# Patient Record
Sex: Male | Born: 1945 | Race: Black or African American | Hispanic: No | Marital: Married | Smoking: Never smoker
Health system: Southern US, Community
[De-identification: ages and names within clinical notes are randomized; demographics above are authoritative.]

## PROBLEM LIST (undated history)

## (undated) DIAGNOSIS — D869 Sarcoidosis, unspecified: Secondary | ICD-10-CM

## (undated) DIAGNOSIS — J302 Other seasonal allergic rhinitis: Secondary | ICD-10-CM

## (undated) DIAGNOSIS — N529 Male erectile dysfunction, unspecified: Secondary | ICD-10-CM

## (undated) HISTORY — PX: BUNIONECTOMY: SHX129

## (undated) HISTORY — PX: KNEE SURGERY: SHX244

## (undated) HISTORY — PX: HAND SURGERY: SHX662

---

## 1995-06-08 DIAGNOSIS — D869 Sarcoidosis, unspecified: Secondary | ICD-10-CM

## 1995-06-08 HISTORY — DX: Sarcoidosis, unspecified: D86.9

## 2010-09-18 ENCOUNTER — Ambulatory Visit: Payer: Self-pay | Admitting: Family Medicine

## 2011-02-09 ENCOUNTER — Ambulatory Visit: Payer: Self-pay

## 2011-09-12 ENCOUNTER — Ambulatory Visit: Payer: Self-pay | Admitting: Internal Medicine

## 2012-05-03 ENCOUNTER — Ambulatory Visit: Payer: Self-pay | Admitting: Internal Medicine

## 2015-02-18 ENCOUNTER — Encounter: Payer: Self-pay | Admitting: Emergency Medicine

## 2015-02-18 ENCOUNTER — Ambulatory Visit
Admission: EM | Admit: 2015-02-18 | Discharge: 2015-02-18 | Disposition: A | Discharge status: Home / Self Care | Payer: PPO | Attending: Family Medicine | Admitting: Family Medicine

## 2015-02-18 DIAGNOSIS — J309 Allergic rhinitis, unspecified: Secondary | ICD-10-CM | POA: Diagnosis not present

## 2015-02-18 HISTORY — DX: Sarcoidosis, unspecified: D86.9

## 2015-02-18 MED ORDER — FLUTICASONE PROPIONATE 50 MCG/ACT NA SUSP: 2.0000 | Freq: Every day | NASAL | Status: DC

## 2015-02-18 MED ORDER — LORATADINE 10 MG PO TABS: 10.0000 mg | ORAL_TABLET | Freq: Every day | ORAL | Status: DC

## 2015-02-18 NOTE — Discharge Instructions (Signed)
Take medication as prescribed. Avoid triggers. Rest. Drink plenty of water.   Follow up with your primary care physician [place] this week as needed. Return to Urgent care or follow up with your primary care physician for continued complaints, fevers, sinus pressure, new or worsening concerns.   Allergic Rhinitis Allergic rhinitis is when the mucous membranes in the nose respond to allergens. Allergens are particles in the air that cause your body to have an allergic reaction. This causes you to release allergic antibodies. Through a chain of events, these eventually cause you to release histamine into the blood stream. Although meant to protect the body, it is this release of histamine that causes your discomfort, such as frequent sneezing, congestion, and an itchy, runny nose.  CAUSES  Seasonal allergic rhinitis (hay fever) is caused by pollen allergens that may come from grasses, trees, and weeds. Year-round allergic rhinitis (perennial allergic rhinitis) is caused by allergens such as house dust mites, pet dander, and mold spores.  SYMPTOMS   Nasal stuffiness (congestion).  Itchy, runny nose with sneezing and tearing of the eyes. DIAGNOSIS  Your health care provider can help you determine the allergen or allergens that trigger your symptoms. If you and your health care provider are unable to determine the allergen, skin or blood testing may be used. TREATMENT  Allergic rhinitis does not have a cure, but it can be controlled by:  Medicines and allergy shots (immunotherapy).  Avoiding the allergen. Hay fever may often be treated with antihistamines in pill or nasal spray forms. Antihistamines block the effects of histamine. There are over-the-counter medicines that may help with nasal congestion and swelling around the eyes. Check with your health care provider before taking or giving this medicine.  If avoiding the allergen or the medicine prescribed do not work, there are many new  medicines your health care provider can prescribe. Stronger medicine may be used if initial measures are ineffective. Desensitizing injections can be used if medicine and avoidance does not work. Desensitization is when a patient is given ongoing shots until the body becomes less sensitive to the allergen. Make sure you follow up with your health care provider if problems continue. HOME CARE INSTRUCTIONS It is not possible to completely avoid allergens, but you can reduce your symptoms by taking steps to limit your exposure to them. It helps to know exactly what you are allergic to so that you can avoid your specific triggers. SEEK MEDICAL CARE IF:   You have a fever.  You develop a cough that does not stop easily (persistent).  You have shortness of breath.  You start wheezing.  Symptoms interfere with normal daily activities. Document Released: 02/16/2001 Document Revised: 05/29/2013 Document Reviewed: 01/29/2013 [facility] Patient Information 2015 [place], [place]. This information is not intended to replace advice given to you by your health care provider. Make sure you discuss any questions you have with your health care provider.

## 2015-02-18 NOTE — ED Notes (Signed)
Patient c/o nasal congestion, sinus pressure and congestion for 2 days. Patient denies fevers.

## 2015-02-18 NOTE — ED Provider Notes (Signed)
[facility] Emergency Department Provider Note  ____________________________________________  Time seen: Approximately 2:37 PM  I have reviewed the triage vital signs and the nursing notes.   HISTORY  Chief Complaint Facial Pain and Nasal Congestion   HPI Gabriel Decker is a 69 y.o. male presents for complaints of 2 days of runny nose and nasal congestion. Patient reports that he has been sneezing and having an itchy nose. Patient reports that he noticed it Sunday afternoon after being outside and then states that Monday he mowed his yard which worsened symptoms. Denies continued cough but states that he feels a tickle in the back of his throat frequently and feels that he has to cough some to clear throat.  Denies fevers. Denies headache. Denies chest pain, shortness of breath, abdominal pain, wheezing or other complaints. Reports continues to eat and drink well. Denies weakness. Denies pain.  Reports he has some history of seasonal allergies and feels similar. States "I don't think I need an antibiotic."    Past Medical History  Diagnosis Date  . Sarcoidosis     There are no active problems to display for this patient.   Past Surgical History  Procedure Laterality Date  . Knee surgery Bilateral     Current Outpatient Rx  Name  Route  Sig  Dispense  Refill  . none          .             Allergies codeine  History reviewed. No pertinent family history.  Social History Social History  Substance Use Topics  . Smoking status: Never Smoker   . Smokeless tobacco: None  . Alcohol Use: Yes    Review of Systems Constitutional: No fever/chills Eyes: No visual changes. ENT: runny nose, congestion, sneezing.  Cardiovascular: Denies chest pain. Respiratory: Denies shortness of breath. Gastrointestinal: No abdominal pain.  No nausea, no vomiting.  No diarrhea.  No constipation. Genitourinary: Negative for dysuria. Musculoskeletal: Negative for  back pain. Skin: Negative for rash. Neurological: Negative for headaches, focal weakness or numbness.  10-point ROS otherwise negative.  ____________________________________________   PHYSICAL EXAM:  VITAL SIGNS: ED Triage Vitals  Enc Vitals Group     BP 02/18/15 1410 131/87 mmHg     Pulse Rate 02/18/15 1410 58     Resp 02/18/15 1410 16     Temp 02/18/15 1410 97.3 F (36.3 C)     Temp Source 02/18/15 1410 Tympanic     SpO2 02/18/15 1410 100 %     Weight 02/18/15 1410 168 lb (76.204 kg)     Height 02/18/15 1410 5\' 9"  (1.753 m)     Head Cir --      Peak Flow --      Pain Score --      Pain Loc --      Pain Edu? --      Excl. in GC? --     Constitutional: Alert and oriented. Well appearing and in no acute distress. Eyes: Conjunctivae are normal. PERRL. EOMI. Head: Atraumatic.  Ears: no erythema, normal TMs bilaterally.   Nose: mild clear runny nose, nares patent, mild bilateral nasal turbinate edema.   Mouth/Throat: Mucous membranes are moist.  Oropharynx non-erythematous. Neck: No stridor.  No cervical spine tenderness to palpation. Hematological/Lymphatic/Immunilogical: No cervical lymphadenopathy. Cardiovascular: Normal rate, regular rhythm. Grossly normal heart sounds.  Good peripheral circulation. Respiratory: Normal respiratory effort.  No retractions. Lungs CTAB. Gastrointestinal: Soft and nontender. No distention. Normal Bowel sounds.  No abdominal bruits. No CVA tenderness. Musculoskeletal: No lower or upper extremity tenderness nor edema.  No joint effusions. Bilateral pedal pulses equal and easily palpated.  Neurologic:  Normal speech and language. No gross focal neurologic deficits are appreciated. No gait instability. Skin:  Skin is warm, dry and intact. No rash noted. Psychiatric: Mood and affect are normal. Speech and behavior are normal.  ____________________________________________   LABS (all labs ordered are listed, but only abnormal results are  displayed)  Labs Reviewed - No data to display ____________________________________________ INITIAL IMPRESSION / ASSESSMENT AND PLAN / ED COURSE  Pertinent labs & imaging results that were available during my care of the patient were reviewed by me and considered in my medical decision making (see chart for details).  Very well appearing. No acute distress. Presents for complaints of 2 days runny nose, sneezing, itchy nose after being outside more. Reports takes OTC Claritin normally but states ran out. States history of similar in past and had good results with "nasal spray". Will treat with allergic rhinitis with claritin and fluticasone nasal spray daily, supportive treatments and avoidance of triggers. Discussed strict follow up and return parameters. Follow up with PCP this week as needed. Patient verbalized understanding and agreed to plan.  ____________________________________________   FINAL CLINICAL IMPRESSION(S) / ED DIAGNOSES  Final diagnoses:  Allergic rhinitis, unspecified allergic rhinitis type       Renford Dills, NP 02/18/15 1514

## 2015-07-27 ENCOUNTER — Ambulatory Visit
Admission: EM | Admit: 2015-07-27 | Discharge: 2015-07-27 | Disposition: A | Discharge status: Home / Self Care | Payer: PPO | Attending: Family Medicine | Admitting: Family Medicine

## 2015-07-27 DIAGNOSIS — J0100 Acute maxillary sinusitis, unspecified: Secondary | ICD-10-CM | POA: Diagnosis not present

## 2015-07-27 MED ORDER — LORATADINE-PSEUDOEPHEDRINE ER 10-240 MG PO TB24: 1.0000 | ORAL_TABLET | Freq: Every day | ORAL | Status: DC

## 2015-07-27 MED ORDER — AMOXICILLIN-POT CLAVULANATE 875-125 MG PO TABS: 1.0000 | ORAL_TABLET | Freq: Two times a day (BID) | ORAL | Status: DC

## 2015-07-27 NOTE — Discharge Instructions (Signed)
Sinusitis, Adult Sinusitis is redness, soreness, and puffiness (inflammation) of the air pockets in the bones of your face (sinuses). The redness, soreness, and puffiness can cause air and mucus to get trapped in your sinuses. This can allow germs to grow and cause an infection.  HOME CARE   Drink enough fluids to keep your pee (urine) clear or pale yellow.  Use a humidifier in your home.  Run a hot shower to create steam in the bathroom. Sit in the bathroom with the door closed. Breathe in the steam 3-4 times a day.  Put a warm, moist washcloth on your face 3-4 times a day, or as told by your doctor.  Use salt water sprays (saline sprays) to wet the thick fluid in your nose. This can help the sinuses drain.  Only take medicine as told by your doctor. GET HELP RIGHT AWAY IF:   Your pain gets worse.  You have very bad headaches.  You are sick to your stomach (nauseous).  You throw up (vomit).  You are very sleepy (drowsy) all the time.  Your face is puffy (swollen).  Your vision changes.  You have a stiff neck.  You have trouble breathing. MAKE SURE YOU:   Understand these instructions.  Will watch your condition.  Will get help right away if you are not doing well or get worse.   This information is not intended to replace advice given to you by your health care provider. Make sure you discuss any questions you have with your health care provider.   Document Released: 11/10/2007 Document Revised: 06/14/2014 Document Reviewed: 12/28/2011 Elsevier Interactive Patient Education 2016 [employer].  Sinusitis, Adult Sinusitis is redness, soreness, and puffiness (inflammation) of the air pockets in the bones of your face (sinuses). The redness, soreness, and puffiness can cause air and mucus to get trapped in your sinuses. This can allow germs to grow and cause an infection.  HOME CARE   Drink enough fluids to keep your pee (urine) clear or pale yellow.  Use a  humidifier in your home.  Run a hot shower to create steam in the bathroom. Sit in the bathroom with the door closed. Breathe in the steam 3-4 times a day.  Put a warm, moist washcloth on your face 3-4 times a day, or as told by your doctor.  Use salt water sprays (saline sprays) to wet the thick fluid in your nose. This can help the sinuses drain.  Only take medicine as told by your doctor. GET HELP RIGHT AWAY IF:   Your pain gets worse.  You have very bad headaches.  You are sick to your stomach (nauseous).  You throw up (vomit).  You are very sleepy (drowsy) all the time.  Your face is puffy (swollen).  Your vision changes.  You have a stiff neck.  You have trouble breathing. MAKE SURE YOU:   Understand these instructions.  Will watch your condition.  Will get help right away if you are not doing well or get worse.   This information is not intended to replace advice given to you by your health care provider. Make sure you discuss any questions you have with your health care provider.   Document Released: 11/10/2007 Document Revised: 06/14/2014 Document Reviewed: 12/28/2011 Elsevier Interactive Patient Education 2016 [employer] Inc[employer]  Upper Respiratory Infection, Adult Most upper respiratory infections (URIs) are caused by a virus. A URI affects the nose, throat, and upper air passages. The most common type of URI is  often called "the common cold." HOME CARE   Take medicines only as told by your doctor.  Gargle warm saltwater or take cough drops to comfort your throat as told by your doctor.  Use a warm mist humidifier or inhale steam from a shower to increase air moisture. This may make it easier to breathe.  Drink enough fluid to keep your pee (urine) clear or pale yellow.  Eat soups and other clear broths.  Have a healthy diet.  Rest as needed.  Go back to work when your fever is gone or your doctor says it is okay.  You may need to stay home longer to  avoid giving your URI to others.  You can also wear a face mask and wash your hands often to prevent spread of the virus.  Use your inhaler more if you have asthma.  Do not use any tobacco products, including cigarettes, chewing tobacco, or electronic cigarettes. If you need help quitting, ask your doctor. GET HELP IF:  You are getting worse, not better.  Your symptoms are not helped by medicine.  You have chills.  You are getting more short of breath.  You have brown or red mucus.  You have yellow or brown discharge from your nose.  You have pain in your face, especially when you bend forward.  You have a fever.  You have puffy (swollen) neck glands.  You have pain while swallowing.  You have white areas in the back of your throat. GET HELP RIGHT AWAY IF:   You have very bad or constant:  Headache.  Ear pain.  Pain in your forehead, behind your eyes, and over your cheekbones (sinus pain).  Chest pain.  You have long-lasting (chronic) lung disease and any of the following:  Wheezing.  Long-lasting cough.  Coughing up blood.  A change in your usual mucus.  You have a stiff neck.  You have changes in your:  Vision.  Hearing.  Thinking.  Mood. MAKE SURE YOU:   Understand these instructions.  Will watch your condition.  Will get help right away if you are not doing well or get worse.   This information is not intended to replace advice given to you by your health care provider. Make sure you discuss any questions you have with your health care provider.   Document Released: 11/10/2007 Document Revised: 10/08/2014 Document Reviewed: 08/29/2013 Elsevier Interactive Patient Education [employer]

## 2015-07-27 NOTE — ED Provider Notes (Signed)
CSN: [id number]     Arrival date & time 07/27/15  1526 History   First MD Initiated Contact with Patient 07/27/15 1723    Nurses notes were reviewed. Chief Complaint  Patient presents with  . Sinusitis   patient is a sinusitis he states for the last 6 days he's had increased pressure behind his eyes and lungs nostril area. Yellowish-green when blows his nose. This morning he states he was quite concerned because he saw blood as well. He's had trouble sinus infections before. He uses Flonase nasal spray but further questioning he can taste the Flonase without back stent. He also uses Claritin and neither those 2 agents helped him any  Patient does not smoke and denies significant medical problems biliary is a history according to his chart sarcoidosis but not sure this is active and not. He's never smoked and he states that there is a history hypertension the family but he doesn't have it. (Consider location/radiation/quality/duration/timing/severity/associated sxs/prior Treatment) Patient is a 70 y.o. male presenting with sinusitis. The history is provided by the patient. No language interpreter was used.  Sinusitis Pain details:    Location:  Maxillary   Quality:  Aching   Severity:  Moderate   Duration:  6 days Progression:  Worsening Chronicity:  New Context: recent URI   Relieved by:  Nothing Ineffective treatments:  Spray decongestants and oral decongestants Associated symptoms: sneezing   Associated symptoms: no chest pain, no chills, no fever, no hoarse voice, no shortness of breath, no sore throat and no wheezing   Associated symptoms comment:  Epistaxis   Past Medical History  Diagnosis Date  . Sarcoidosis [facility])    Past Surgical History  Procedure Laterality Date  . Knee surgery Bilateral    History reviewed. No pertinent family history. Social History  Substance Use Topics  . Smoking status: Never Smoker   . Smokeless tobacco: None  . Alcohol Use: 0.0 oz/week    0  Standard drinks or equivalent per week     Comment: occasional    Review of Systems  Constitutional: Negative for fever and chills.  HENT: Positive for sneezing. Negative for hoarse voice and sore throat.   Respiratory: Negative for shortness of breath and wheezing.   Cardiovascular: Negative for chest pain.  All other systems reviewed and are negative.   Allergies  Lodine  Home Medications   Prior to Admission medications   Medication Sig Start Date End Date Taking? Authorizing Provider  fluticasone (FLONASE) 50 MCG/ACT nasal spray Place 2 sprays into both nostrils daily. 02/18/15  Yes Renford Dills, NP  loratadine (CLARITIN) 10 MG tablet Take 1 tablet (10 mg total) by mouth daily. 02/18/15  Yes Renford Dills, NP  amoxicillin-clavulanate (AUGMENTIN) 875-125 MG tablet Take 1 tablet by mouth 2 (two) times daily. 07/27/15   Hassan Rowan, MD  loratadine-pseudoephedrine (CLARITIN-D 24 HOUR) 10-240 MG 24 hr tablet Take 1 tablet by mouth daily. Do not take with plain Claritin 07/27/15   Hassan Rowan, MD   Meds Ordered and Administered this Visit  Medications - No data to display  BP 128/81 mmHg  Pulse 59  Temp(Src) 97.1 F (36.2 C) (Oral)  Resp 16  Ht  (1.753 m)  Wt 171 lb (77.565 kg)  BMI 25.24 kg/m2  SpO2 100% No data found.   Physical Exam  Constitutional: He is oriented to person, place, and time. He appears well-developed and well-nourished.  HENT:  Head: Normocephalic and atraumatic.  Right Ear: Hearing, tympanic membrane and  external ear normal.  Left Ear: Hearing, tympanic membrane, external ear and ear canal normal.  Nose: Mucosal edema present. Right sinus exhibits maxillary sinus tenderness. Left sinus exhibits maxillary sinus tenderness.  Mouth/Throat: Posterior oropharyngeal erythema present.  Eyes: Pupils are equal, round, and reactive to light.  Neck: Normal range of motion. Neck supple.  Cardiovascular: Normal rate and regular rhythm.   Pulmonary/Chest:  Effort normal.  Musculoskeletal: Normal range of motion.  Lymphadenopathy:    He has cervical adenopathy.  Neurological: He is alert and oriented to person, place, and time.  Skin: Skin is warm and dry. No erythema.  Psychiatric: He has a normal mood and affect.  Vitals reviewed.   ED Course  Procedures (including critical care time)  Labs Review Labs Reviewed - No data to display  Imaging Review No results found.   Visual Acuity Review  Right Eye Distance:   Left Eye Distance:   Bilateral Distance:    Right Eye Near:   Left Eye Near:    Bilateral Near:         MDM   1. Acute maxillary sinusitis, recurrence not specified    We'll place patient on Augmentin 875 one tablet twice a day and we'll switch him from Claritin to Claritin-D 1 tablet daily. We'll give instructions how to use the Flonase to display more effective since he can taste it in appropriate explained to him that if he can taste it is not. Recommend follow-up with his PCP in 2 weeks. If not better. Offer work note patient laughed.  Note: This dictation was prepared with Dragon dictation along with smaller phrase technology. Any transcriptional errors that result from this process are unintentional.  Hassan Rowan, MD 07/27/15 1740

## 2015-07-27 NOTE — ED Notes (Signed)
Patient complains of sinus pain and pressure. Patient states that he has been blowing green and blood tinged mucus. He states that symptoms started 1 week ago and have not improved, patient has tried Claritin and Flonase with no relief.

## 2015-10-07 DIAGNOSIS — D8683 Sarcoid iridocyclitis: Secondary | ICD-10-CM | POA: Diagnosis not present

## 2015-10-29 DIAGNOSIS — S4991XA Unspecified injury of right shoulder and upper arm, initial encounter: Secondary | ICD-10-CM | POA: Diagnosis not present

## 2015-10-29 DIAGNOSIS — M25511 Pain in right shoulder: Secondary | ICD-10-CM | POA: Diagnosis not present

## 2015-10-29 DIAGNOSIS — M19011 Primary osteoarthritis, right shoulder: Secondary | ICD-10-CM | POA: Diagnosis not present

## 2015-10-29 DIAGNOSIS — M7581 Other shoulder lesions, right shoulder: Secondary | ICD-10-CM | POA: Diagnosis not present

## 2015-11-03 DIAGNOSIS — M25511 Pain in right shoulder: Secondary | ICD-10-CM | POA: Diagnosis not present

## 2015-11-10 DIAGNOSIS — M25511 Pain in right shoulder: Secondary | ICD-10-CM | POA: Diagnosis not present

## 2015-11-17 DIAGNOSIS — M25511 Pain in right shoulder: Secondary | ICD-10-CM | POA: Diagnosis not present

## 2015-11-27 DIAGNOSIS — Z125 Encounter for screening for malignant neoplasm of prostate: Secondary | ICD-10-CM | POA: Diagnosis not present

## 2015-11-27 DIAGNOSIS — Z0000 Encounter for general adult medical examination without abnormal findings: Secondary | ICD-10-CM | POA: Diagnosis not present

## 2015-11-27 DIAGNOSIS — Z23 Encounter for immunization: Secondary | ICD-10-CM | POA: Diagnosis not present

## 2015-11-27 DIAGNOSIS — Z79899 Other long term (current) drug therapy: Secondary | ICD-10-CM | POA: Diagnosis not present

## 2016-02-06 DIAGNOSIS — M25511 Pain in right shoulder: Secondary | ICD-10-CM | POA: Diagnosis not present

## 2016-03-05 DIAGNOSIS — Z23 Encounter for immunization: Secondary | ICD-10-CM | POA: Diagnosis not present

## 2016-03-31 DIAGNOSIS — R030 Elevated blood-pressure reading, without diagnosis of hypertension: Secondary | ICD-10-CM | POA: Diagnosis not present

## 2016-05-06 ENCOUNTER — Encounter
Admission: RE | Admit: 2016-05-06 | Discharge: 2016-05-06 | Disposition: A | Discharge status: Home / Self Care | Payer: PPO | Source: Ambulatory Visit | Attending: Surgery | Admitting: Surgery

## 2016-05-06 NOTE — Patient Instructions (Signed)
  Your procedure is scheduled on: 05-13-16 [facility](THURSDAY) Report to Same Day Surgery 2nd floor medical mall [facility](Medical Mall Entrance-take elevator on left to 2nd floor.  Check in with surgery information desk.) To find out your arrival time please call [phone] between 1PM - 3PM on 05-12-16 [facility](WEDNESDAY)  Remember: Instructions that are not followed completely may result in serious medical risk, up to and including death, or upon the discretion of your surgeon and anesthesiologist your surgery may need to be rescheduled.    _x___ 1. Do not eat food or drink liquids after midnight. No gum chewing or hard candies.     __x__ 2. No Alcohol for 24 hours before or after surgery.   __x__3. No Smoking for 24 prior to surgery.   ____  4. Bring all medications with you on the day of surgery if instructed.    __x__ 5. Notify your doctor if there is any change in your medical condition     (cold, fever, infections).     Do not wear jewelry, make-up, hairpins, clips or nail polish.  Do not wear lotions, powders, or perfumes. You may wear deodorant.  Do not shave 48 hours prior to surgery. Men may shave face and neck.  Do not bring valuables to the hospital.    [facility] is not responsible for any belongings or valuables.               Contacts, dentures or bridgework may not be worn into surgery.  Leave your suitcase in the car. After surgery it may be brought to your room.  For patients admitted to the hospital, discharge time is determined by your treatment team.   Patients discharged the day of surgery will not be allowed to drive home.  You will need someone to drive you home and stay with you the night of your procedure.    Please read over the following fact sheets that you were given:   [facility] Preparing [facility] and or MRSA Information   ____ Take these medicines the morning of surgery with A SIP OF WATER:    1. NONE  2.  3.  4.  5.  6.  ____Fleets enema or Magnesium Citrate  as directed.   _x___ Use CHG Soap or sage wipes as directed on instruction sheet   ____ Use inhalers on the day of surgery and bring to hospital day of surgery  ____ Stop metformin 2 days prior to surgery    ____ Take 1/2 of usual insulin dose the night before surgery and none on the morning of  surgery.   _X___ Stop Aspirin, Coumadin, Pllavix ,Eliquis, Effient, or Pradaxa-STOP ASPIRIN NOW  x__ Stop Anti-inflammatories such as Advil, Aleve, Ibuprofen, Motrin, Naproxen,          Naprosyn, Goodies powders or aspirin products. Ok to take Tylenol.   _X___ Stop supplements until after surgery-STOP VITAMIN C NOW  ____ Bring C-Pap to the hospital.

## 2016-05-10 ENCOUNTER — Inpatient Hospital Stay: Admission: RE | Admit: 2016-05-10 | Payer: PPO | Source: Ambulatory Visit

## 2016-05-10 ENCOUNTER — Encounter
Admission: RE | Admit: 2016-05-10 | Discharge: 2016-05-10 | Disposition: A | Discharge status: Home / Self Care | Payer: PPO | Source: Ambulatory Visit | Attending: Surgery | Admitting: Surgery

## 2016-05-10 DIAGNOSIS — M75121 Complete rotator cuff tear or rupture of right shoulder, not specified as traumatic: Secondary | ICD-10-CM

## 2016-05-10 DIAGNOSIS — Z01812 Encounter for preprocedural laboratory examination: Secondary | ICD-10-CM | POA: Insufficient documentation

## 2016-05-10 DIAGNOSIS — Z01810 Encounter for preprocedural cardiovascular examination: Secondary | ICD-10-CM | POA: Diagnosis not present

## 2016-05-10 DIAGNOSIS — M7541 Impingement syndrome of right shoulder: Secondary | ICD-10-CM | POA: Diagnosis not present

## 2016-05-10 LAB — CBC
HCT: 46.6 % (ref 40.0–52.0)
HEMOGLOBIN: 16.1 g/dL (ref 13.0–18.0)
MCH: 30.6 pg (ref 26.0–34.0)
MCHC: 34.6 g/dL (ref 32.0–36.0)
MCV: 88.4 fL (ref 80.0–100.0)
Platelets: 141 10*3/uL — ABNORMAL LOW (ref 150–440)
RBC: 5.27 MIL/uL (ref 4.40–5.90)
RDW: 13.6 % (ref 11.5–14.5)
WBC: 2.9 10*3/uL — AB (ref 3.8–10.6)

## 2016-05-13 ENCOUNTER — Ambulatory Visit
Admission: RE | Admit: 2016-05-13 | Discharge: 2016-05-13 | Disposition: A | Discharge status: Home / Self Care | Payer: PPO | Source: Ambulatory Visit | Attending: Surgery | Admitting: Surgery

## 2016-05-13 ENCOUNTER — Encounter
Admission: RE | Disposition: A | Discharge status: Home / Self Care | Payer: Self-pay | Source: Ambulatory Visit | Attending: Surgery

## 2016-05-13 ENCOUNTER — Ambulatory Visit: Payer: PPO | Admitting: Anesthesiology

## 2016-05-13 ENCOUNTER — Encounter: Payer: Self-pay | Admitting: *Deleted

## 2016-05-13 DIAGNOSIS — M75121 Complete rotator cuff tear or rupture of right shoulder, not specified as traumatic: Secondary | ICD-10-CM | POA: Insufficient documentation

## 2016-05-13 DIAGNOSIS — M75101 Unspecified rotator cuff tear or rupture of right shoulder, not specified as traumatic: Secondary | ICD-10-CM | POA: Diagnosis not present

## 2016-05-13 DIAGNOSIS — M7541 Impingement syndrome of right shoulder: Secondary | ICD-10-CM | POA: Diagnosis not present

## 2016-05-13 DIAGNOSIS — Z5333 Arthroscopic surgical procedure converted to open procedure: Secondary | ICD-10-CM | POA: Diagnosis not present

## 2016-05-13 DIAGNOSIS — M7581 Other shoulder lesions, right shoulder: Secondary | ICD-10-CM | POA: Diagnosis not present

## 2016-05-13 DIAGNOSIS — M25511 Pain in right shoulder: Secondary | ICD-10-CM | POA: Diagnosis not present

## 2016-05-13 DIAGNOSIS — M24111 Other articular cartilage disorders, right shoulder: Secondary | ICD-10-CM | POA: Diagnosis not present

## 2016-05-13 DIAGNOSIS — G8918 Other acute postprocedural pain: Secondary | ICD-10-CM | POA: Diagnosis not present

## 2016-05-13 HISTORY — PX: SHOULDER ARTHROSCOPY WITH SUBACROMIAL DECOMPRESSION: SHX5684

## 2016-05-13 HISTORY — PX: SHOULDER ARTHROSCOPY WITH OPEN ROTATOR CUFF REPAIR: SHX6092

## 2016-05-13 SURGERY — ARTHROSCOPY, SHOULDER WITH REPAIR, ROTATOR CUFF, OPEN
Anesthesia: General | Site: Shoulder | Laterality: Right | Wound class: Clean

## 2016-05-13 MED ORDER — ROPIVACAINE HCL 5 MG/ML IJ SOLN
INTRAMUSCULAR | Status: AC
  Administered 2016-05-13: 30 mL via PERINEURAL
  Filled 2016-05-13: qty 40

## 2016-05-13 MED ORDER — FENTANYL CITRATE (PF) 100 MCG/2ML IJ SOLN
INTRAMUSCULAR | Status: DC | PRN
  Administered 2016-05-13 (×3): 50 ug via INTRAVENOUS

## 2016-05-13 MED ORDER — BUPIVACAINE HCL (PF) 0.5 % IJ SOLN
INTRAMUSCULAR | Status: AC
  Filled 2016-05-13: qty 30

## 2016-05-13 MED ORDER — MIDAZOLAM HCL 2 MG/2ML IJ SOLN
INTRAMUSCULAR | Status: AC
  Administered 2016-05-13: 1.5 mg via INTRAVENOUS
  Filled 2016-05-13: qty 2

## 2016-05-13 MED ORDER — FAMOTIDINE 20 MG PO TABS
ORAL_TABLET | ORAL | Status: AC
  Administered 2016-05-13: 20 mg via ORAL
  Filled 2016-05-13: qty 1

## 2016-05-13 MED ORDER — GLYCOPYRROLATE 0.2 MG/ML IJ SOLN
INTRAMUSCULAR | Status: DC | PRN
  Administered 2016-05-13: 0.3 mg via INTRAVENOUS
  Administered 2016-05-13: 0.2 mg via INTRAVENOUS

## 2016-05-13 MED ORDER — PROPOFOL 10 MG/ML IV BOLUS
INTRAVENOUS | Status: DC | PRN
  Administered 2016-05-13: 200 mg via INTRAVENOUS

## 2016-05-13 MED ORDER — EPINEPHRINE PF 1 MG/ML IJ SOLN
INTRAMUSCULAR | Status: AC
  Filled 2016-05-13: qty 1

## 2016-05-13 MED ORDER — LACTATED RINGERS IV SOLN
INTRAVENOUS | Status: DC
  Administered 2016-05-13 (×2): via INTRAVENOUS

## 2016-05-13 MED ORDER — CEFAZOLIN SODIUM-DEXTROSE 2-4 GM/100ML-% IV SOLN
2.0000 g | Freq: Once | INTRAVENOUS | Status: AC
  Administered 2016-05-13: 2 g via INTRAVENOUS

## 2016-05-13 MED ORDER — METOCLOPRAMIDE HCL 5 MG/ML IJ SOLN: 5.0000 mg | Freq: Three times a day (TID) | INTRAMUSCULAR | Status: DC | PRN

## 2016-05-13 MED ORDER — LIDOCAINE HCL (PF) 4 % IJ SOLN
INTRAMUSCULAR | Status: DC | PRN
  Administered 2016-05-13: 4 mL via RESPIRATORY_TRACT

## 2016-05-13 MED ORDER — EPINEPHRINE PF 1 MG/ML IJ SOLN
INTRAMUSCULAR | Status: DC | PRN
  Administered 2016-05-13: 2 mL

## 2016-05-13 MED ORDER — ONDANSETRON HCL 4 MG PO TABS: 4.0000 mg | ORAL_TABLET | Freq: Four times a day (QID) | ORAL | Status: DC | PRN

## 2016-05-13 MED ORDER — FAMOTIDINE 20 MG PO TABS
20.0000 mg | ORAL_TABLET | Freq: Once | ORAL | Status: AC
  Administered 2016-05-13: 20 mg via ORAL

## 2016-05-13 MED ORDER — EPINEPHRINE PF 1 MG/ML IJ SOLN
INTRAMUSCULAR | Status: AC
  Filled 2016-05-13: qty 3

## 2016-05-13 MED ORDER — ONDANSETRON HCL 4 MG/2ML IJ SOLN: 4.0000 mg | Freq: Four times a day (QID) | INTRAMUSCULAR | Status: DC | PRN

## 2016-05-13 MED ORDER — BUPIVACAINE-EPINEPHRINE 0.25% -1:200000 IJ SOLN
INTRAMUSCULAR | Status: DC | PRN
  Administered 2016-05-13: 30 mL

## 2016-05-13 MED ORDER — MIDAZOLAM HCL 2 MG/2ML IJ SOLN
1.5000 mg | Freq: Once | INTRAMUSCULAR | Status: AC
  Administered 2016-05-13: 1.5 mg via INTRAVENOUS

## 2016-05-13 MED ORDER — ONDANSETRON HCL 4 MG/2ML IJ SOLN
INTRAMUSCULAR | Status: DC | PRN
  Administered 2016-05-13: 4 mg via INTRAVENOUS

## 2016-05-13 MED ORDER — LIDOCAINE HCL (CARDIAC) 20 MG/ML IV SOLN
INTRAVENOUS | Status: DC | PRN
  Administered 2016-05-13: 70 mg via INTRAVENOUS

## 2016-05-13 MED ORDER — LIDOCAINE HCL (PF) 1 % IJ SOLN
5.0000 mL | Freq: Once | INTRAMUSCULAR | Status: AC
  Administered 2016-05-13: 5 mL via INTRADERMAL

## 2016-05-13 MED ORDER — LIDOCAINE HCL (PF) 1 % IJ SOLN
INTRAMUSCULAR | Status: AC
  Administered 2016-05-13: 5 mL via INTRADERMAL
  Filled 2016-05-13: qty 5

## 2016-05-13 MED ORDER — ROPIVACAINE HCL 5 MG/ML IJ SOLN
30.0000 mL | Freq: Once | INTRAMUSCULAR | Status: AC
  Administered 2016-05-13: 30 mL via PERINEURAL
  Filled 2016-05-13: qty 30

## 2016-05-13 MED ORDER — FENTANYL CITRATE (PF) 100 MCG/2ML IJ SOLN: 25.0000 ug | INTRAMUSCULAR | Status: DC | PRN

## 2016-05-13 MED ORDER — DEXAMETHASONE SODIUM PHOSPHATE 4 MG/ML IJ SOLN
INTRAMUSCULAR | Status: DC | PRN
Start: 2016-05-13 — End: 2016-05-13
  Administered 2016-05-13: 8 mg via INTRAVENOUS

## 2016-05-13 MED ORDER — SUGAMMADEX SODIUM 200 MG/2ML IV SOLN
INTRAVENOUS | Status: DC | PRN
  Administered 2016-05-13: 150 mg via INTRAVENOUS

## 2016-05-13 MED ORDER — CEFAZOLIN SODIUM-DEXTROSE 2-4 GM/100ML-% IV SOLN
INTRAVENOUS | Status: AC
  Filled 2016-05-13: qty 100

## 2016-05-13 MED ORDER — ROCURONIUM BROMIDE 100 MG/10ML IV SOLN
INTRAVENOUS | Status: DC | PRN
  Administered 2016-05-13: 50 mg via INTRAVENOUS

## 2016-05-13 MED ORDER — POTASSIUM CHLORIDE IN NACL 20-0.9 MEQ/L-% IV SOLN: INTRAVENOUS | Status: DC

## 2016-05-13 MED ORDER — OXYCODONE HCL 5 MG PO TABS: 5.0000 mg | ORAL_TABLET | ORAL | Status: DC | PRN

## 2016-05-13 MED ORDER — BUPIVACAINE-EPINEPHRINE (PF) 0.25% -1:200000 IJ SOLN
INTRAMUSCULAR | Status: AC
  Filled 2016-05-13: qty 30

## 2016-05-13 MED ORDER — OXYCODONE HCL 5 MG PO TABS: 5.0000 mg | ORAL_TABLET | ORAL | 0 refills | Status: DC | PRN

## 2016-05-13 MED ORDER — FENTANYL CITRATE (PF) 100 MCG/2ML IJ SOLN
INTRAMUSCULAR | Status: AC
  Filled 2016-05-13: qty 2

## 2016-05-13 MED ORDER — ONDANSETRON HCL 4 MG/2ML IJ SOLN: 4.0000 mg | Freq: Once | INTRAMUSCULAR | Status: DC | PRN

## 2016-05-13 MED ORDER — PHENYLEPHRINE HCL 10 MG/ML IJ SOLN
INTRAVENOUS | Status: DC | PRN
  Administered 2016-05-13: 25 ug/min via INTRAVENOUS

## 2016-05-13 MED ORDER — METOCLOPRAMIDE HCL 10 MG PO TABS: 5.0000 mg | ORAL_TABLET | Freq: Three times a day (TID) | ORAL | Status: DC | PRN

## 2016-05-13 SURGICAL SUPPLY — 46 items
ANCHOR JUGGERKNOT WTAP NDL 2.9 (Anchor) ×6 IMPLANT
ANCHOR SUT QUATTRO KNTLS 4.5 (Anchor) ×6 IMPLANT
BIT DRILL JUGRKNT W/NDL BIT2.9 (DRILL) ×1 IMPLANT
BLADE FULL RADIUS 3.5 (BLADE) ×3 IMPLANT
BLADE SHAVER 4.5X7 STR FR (MISCELLANEOUS) ×3 IMPLANT
BUR ACROMIONIZER 4.0 (BURR) ×3 IMPLANT
BUR BR 5.5 WIDE MOUTH (BURR) IMPLANT
CANNULA SHAVER 8MMX76MM (CANNULA) ×3 IMPLANT
CHLORAPREP W/TINT 26ML (MISCELLANEOUS) ×6 IMPLANT
COVER MAYO STAND STRL (DRAPES) ×3 IMPLANT
DRAPE IMP U-DRAPE 54X76 (DRAPES) ×6 IMPLANT
DRILL JUGGERKNOT W/NDL BIT 2.9 (DRILL) ×3
DRSG OPSITE POSTOP 4X8 (GAUZE/BANDAGES/DRESSINGS) ×3 IMPLANT
ELECT REM PT RETURN 9FT ADLT (ELECTROSURGICAL) ×3
ELECTRODE REM PT RTRN 9FT ADLT (ELECTROSURGICAL) ×1 IMPLANT
GAUZE PETRO XEROFOAM 1X8 (MISCELLANEOUS) ×3 IMPLANT
GAUZE SPONGE 4X4 12PLY STRL (GAUZE/BANDAGES/DRESSINGS) ×3 IMPLANT
GLOVE BIO SURGEON STRL SZ7.5 (GLOVE) ×6 IMPLANT
GLOVE BIO SURGEON STRL SZ8 (GLOVE) ×6 IMPLANT
GLOVE BIOGEL PI IND STRL 8 (GLOVE) ×1 IMPLANT
GLOVE BIOGEL PI INDICATOR 8 (GLOVE) ×2
GLOVE INDICATOR 8.0 STRL GRN (GLOVE) ×3 IMPLANT
GOWN STRL REUS W/ TWL LRG LVL3 (GOWN DISPOSABLE) ×2 IMPLANT
GOWN STRL REUS W/ TWL XL LVL3 (GOWN DISPOSABLE) ×1 IMPLANT
GOWN STRL REUS W/TWL LRG LVL3 (GOWN DISPOSABLE) ×4
GOWN STRL REUS W/TWL XL LVL3 (GOWN DISPOSABLE) ×2
GRASPER SUT 15 45D LOW PRO (SUTURE) IMPLANT
IV LACTATED RINGER IRRG 3000ML (IV SOLUTION) ×4
IV LR IRRIG 3000ML ARTHROMATIC (IV SOLUTION) ×2 IMPLANT
MANIFOLD NEPTUNE II (INSTRUMENTS) ×3 IMPLANT
MASK FACE SPIDER DISP (MASK) ×3 IMPLANT
MAT BLUE FLOOR 46X72 FLO (MISCELLANEOUS) ×3 IMPLANT
NEEDLE REVERSE CUT 1/2 CRC (NEEDLE) IMPLANT
PACK ARTHROSCOPY SHOULDER (MISCELLANEOUS) ×3 IMPLANT
SLING ARM LRG DEEP (SOFTGOODS) IMPLANT
SLING ULTRA II LG (MISCELLANEOUS) ×3 IMPLANT
STAPLER SKIN PROX 35W (STAPLE) ×3 IMPLANT
STRAP SAFETY BODY (MISCELLANEOUS) ×3 IMPLANT
SUT ETHIBOND 0 MO6 C/R (SUTURE) ×3 IMPLANT
SUT VIC AB 2-0 CT1 27 (SUTURE) ×4
SUT VIC AB 2-0 CT1 TAPERPNT 27 (SUTURE) ×2 IMPLANT
TAPE MICROFOAM 4IN (TAPE) ×3 IMPLANT
TUBING ARTHRO INFLOW-ONLY STRL (TUBING) ×3 IMPLANT
TUBING CONNECTING 10 (TUBING) ×2 IMPLANT
TUBING CONNECTING 10' (TUBING) ×1
WAND HAND CNTRL MULTIVAC 90 (MISCELLANEOUS) ×3 IMPLANT

## 2016-05-13 NOTE — Op Note (Signed)
05/13/2016  12:01 PM  Patient:   Gabriel Decker  Pre-Op Diagnosis:   Impingement/tendinopathy with full-thickness rotator cuff tear, right shoulder.  Postoperative diagnosis: Impingement/tendinopathy with full-thickness rotator cuff tear, right shoulder.  Procedure: Limited arthroscopic debridement, arthroscopic subacromial decompression, and mini-open rotator cuff repair, right shoulder.  Anesthesia: General endotracheal with interscalene block placed preoperatively by the anesthesiologist.  Surgeon:   Maryagnes AmosJ. Jeffrey Sanjana Folz, MD  Assistant:   Horris LatinoLance McGhee, PA-C  Findings: As above. The labrum demonstrated some fraying anteriorly and superiorly but otherwise was intact, as was the biceps tendon. There was a full-thickness tear involving the mid insertional fibers of the supraspinatus tendon measuring approximately 1.8 x 2 cm. The remainder the rotator cuff was in satisfactory condition, as were the articular surfaces of the glenoid and humerus.  Complications: None  Fluids:   1000 cc  Estimated blood loss: 5 cc  Tourniquet time: None  Drains: None  Closure: Staples   Brief clinical note: The patient is a 70 year old male with a history of right shoulder pain. The patient's symptoms have progressed despite medications, activity modification, etc. The patient's history and examination are consistent with impingement/tendinopathy with a rotator cuff tear. These findings were confirmed by MRI scan. The patient presents at this time for definitive management of these shoulder symptoms.  Procedure: The patient underwent placement of an interscalene block by the anesthesiologist in the preoperative holding area before he was brought into the operating room and lain in the supine position. The patient then underwent general endotracheal intubation and anesthesia before being repositioned in the beach chair position using the beach chair positioner. The right shoulder and  upper extremity were prepped with ChloraPrep solution before being draped sterilely. Preoperative antibiotics were administered. A timeout was performed to confirm the proper surgical site before the expected portal sites and incision site were injected with 0.25% Sensorcaine with epinephrine. A posterior portal was created and the glenohumeral joint thoroughly inspected with the findings as described above. An anterior portal was created using an outside-in technique. The labrum and rotator cuff were further probed, again confirming the above-noted findings. Areas of synovitis and labral fraying were debrided back to stable margins using the full-radius resector. The ArthroCare wand was inserted and used to obtain hemostasis as well as to "anneal" the labrum superiorly and anteriorly. The instruments were removed from the joint after suctioning the excess fluid.  The camera was repositioned through the posterior portal into the subacromial space. A separate lateral portal was created using an outside-in technique. The 3.5 mm full-radius resector was introduced and used to perform a subtotal bursectomy. The ArthroCare wand was then inserted and used to remove the periosteal tissue off the undersurface of the anterior third of the acromion as well as to recess the coracoacromial ligament from its attachment along the anterior and lateral margins of the acromion. The 4.0 mm acromionizing bur was introduced and used to complete the decompression by removing the undersurface of the anterior third of the acromion. The full radius resector was reintroduced to remove any residual bony debris before the ArthroCare wand was reintroduced to obtain hemostasis. The instruments were then removed from the subacromial space after suctioning the excess fluid.  An approximately 4-5 cm incision was made over the anterolateral aspect of the shoulder beginning at the anterolateral corner of the acromion and extending distally in  line with the bicipital groove. This incision was carried down through the subcutaneous tissues to expose the deltoid fascia. The raphae between the  anterior and middle thirds was identified and this plane developed to provide access into the subacromial space. Additional bursal tissues were debrided sharply using Metzenbaum scissors. The rotator cuff tear was readily identified. The margins were debrided sharply with a #15 blade and the exposed greater tuberosity roughened with a rongeur. The tear was repaired using two Biomet 2.9 mm JuggerKnot anchors. These sutures were then brought back laterally and secured using two [place] QuatroLink anchors to create a two-layer closure. An apparent watertight closure was obtained.  The wound was copiously irrigated with sterile saline solution before the deltoid raphae was reapproximated using 2-0 Vicryl interrupted sutures. The subcutaneous tissues were closed in two layers using 2-0 Vicryl interrupted sutures before the skin was closed using staples. The portal sites also were closed using staples. A sterile bulky dressing was applied to the shoulder before the arm was placed into a shoulder immobilizer. The patient was then awakened, extubated, and returned to the recovery room in satisfactory condition after tolerating the procedure well.

## 2016-05-13 NOTE — Transfer of Care (Signed)
Immediate Anesthesia Transfer of Care Note  Patient: Bright W Stlaurent  Procedure(s) Performed: Procedure(s): SHOULDER ARTHROSCOPY WITH OPEN ROTATOR CUFF REPAIR (Right) SHOULDER ARTHROSCOPY WITH SUBACROMIAL DECOMPRESSION AND LIMITED DEBRIDEMENT (Right)  Patient Location: PACU  Anesthesia Type:General  Level of Consciousness: awake, alert , oriented and patient cooperative  Airway & Oxygen Therapy: Patient Spontanous Breathing and Patient connected to nasal cannula oxygen  Post-op Assessment: Report given to RN and Post -op Vital signs reviewed and stable  Post vital signs: Reviewed and stable  Last Vitals:  Vitals:   05/13/16 1020 05/13/16 1025  BP: 131/70 139/76  Pulse: (!) 56 (!) 56  Resp: 15 15  Temp:      Last Pain:  Vitals:   05/13/16 0823  TempSrc: Oral         Complications: No apparent anesthesia complications

## 2016-05-13 NOTE — Anesthesia Preprocedure Evaluation (Addendum)
Anesthesia Evaluation  Patient identified by MRN, date of birth, ID band Patient awake    Reviewed: Allergy & Precautions, H&P , NPO status , Patient's Chart, lab work & pertinent test results, reviewed documented beta blocker date and time   Airway Mallampati: II  TM Distance: >3 FB Neck ROM: full    Dental  (+) Teeth Intact   Pulmonary neg pulmonary ROS,    Pulmonary exam normal        Cardiovascular negative cardio ROS Normal cardiovascular exam Rhythm:regular Rate:Normal     Neuro/Psych negative neurological ROS  negative psych ROS   GI/Hepatic negative GI ROS, Neg liver ROS,   Endo/Other  negative endocrine ROS  Renal/GU negative Renal ROS  negative genitourinary   Musculoskeletal   Abdominal   Peds  Hematology negative hematology ROS (+)   Anesthesia Other Findings Past Medical History: 1997: Sarcoidosis (HCC)     Comment: PT DENIES ANY ACTIVE PROBLEMS-ASYMPTOMATIC Past Surgical History: No date: BUNIONECTOMY Bilateral No date: HAND SURGERY No date: KNEE SURGERY Bilateral BMI    Body Mass Index:  24.81 kg/m     Reproductive/Obstetrics negative OB ROS                             Anesthesia Physical Anesthesia Plan  ASA: II  Anesthesia Plan: General ETT   Post-op Pain Management:  Regional for Post-op pain   Induction:   Airway Management Planned:   Additional Equipment:   Intra-op Plan:   Post-operative Plan:   Informed Consent: I have reviewed the patients History and Physical, chart, labs and discussed the procedure including the risks, benefits and alternatives for the proposed anesthesia with the patient or authorized representative who has indicated his/her understanding and acceptance.   Dental Advisory Given  Plan Discussed with: CRNA  Anesthesia Plan Comments:         Anesthesia Quick Evaluation

## 2016-05-13 NOTE — Anesthesia Procedure Notes (Signed)
Procedure Name: Intubation Date/Time: 05/13/2016 10:41 AM Performed by: Rosaria Ferries, Minahil Quinlivan Pre-anesthesia Checklist: Patient identified, Emergency Drugs available, Suction available and Patient being monitored Patient Re-evaluated:Patient Re-evaluated prior to inductionOxygen Delivery Method: Circle system utilized Preoxygenation: Pre-oxygenation with 100% oxygen Intubation Type: IV induction Laryngoscope Size: Mac and 3 Grade View: Grade I Tube size: 7.0 mm Number of attempts: 1 Airway Equipment and Method: LTA kit utilized Placement Confirmation: ETT inserted through vocal cords under direct vision,  positive ETCO2 and breath sounds checked- equal and bilateral Secured at: 23 cm Tube secured with: Tape Dental Injury: Teeth and Oropharynx as per pre-operative assessment

## 2016-05-13 NOTE — Discharge Instructions (Addendum)
Keep dressing dry and intact.  May shower after dressing changed on post-op day #4 (Monday).  Cover staples with Band-Aids after drying off. Apply ice frequently to shoulder. Take ibuprofen 600 mg TID with meals for 7-10 days, then as necessary. Take oxycodone as prescribed when needed.  May supplement with ES Tylenol if necessary. Keep shoulder immobilizer on at all times except may remove for bathing purposes. Follow-up in 10-14 days or as scheduled.   AMBULATORY SURGERY  DISCHARGE INSTRUCTIONS   1) The drugs that you were given will stay in your system until tomorrow so for the next 24 hours you should not:  A) Drive an automobile B) Make any legal decisions C) Drink any alcoholic beverage   2) You may resume regular meals tomorrow.  Today it is better to start with liquids and gradually work up to solid foods.  You may eat anything you prefer, but it is better to start with liquids, then soup and crackers, and gradually work up to solid foods.   3) Please notify your doctor immediately if you have any unusual bleeding, trouble breathing, redness and pain at the surgery site, drainage, fever, or pain not relieved by medication.    4) Additional Instructions:   Please contact your physician with any problems or Same Day Surgery at [phone], Monday through Friday 6 am to 4 pm, or [place] at [facility] Main number at [phone].

## 2016-05-13 NOTE — Anesthesia Procedure Notes (Signed)
Anesthesia Regional Block:  Interscalene brachial plexus block  Pre-Anesthetic Checklist: ,, timeout performed, Correct Patient, Correct Site, Correct Laterality, Correct Procedure, Correct Position, site marked, Risks and benefits discussed,  Surgical consent,  Pre-op evaluation,  At surgeon's request and post-op pain management  Laterality: Right  Prep: Betadine       Needles:   Needle Type: Echogenic Stimulator Needle     Needle Length: 10cm 10 cm Needle Gauge: 21 and 21 G    Additional Needles:  Procedures: ultrasound guided (picture in chart) Interscalene brachial plexus block  Nerve Stimulator or Paresthesia:  Response: 100 mA, 3 cm  Additional Responses:   Narrative:  Injection made incrementally with aspirations every 5 mL.  Performed by: Personally  Anesthesiologist: Yevette EdwardsADAMS, JAMES G  Additional Notes: Tolerated well and no pain on injection.  No iv sx with vsst. Fawn KirkJA

## 2016-05-13 NOTE — H&P (Signed)
Paper H&P to be scanned into permanent record. H&P reviewed. No changes. 

## 2016-05-15 NOTE — Anesthesia Postprocedure Evaluation (Signed)
Anesthesia Post Note  Patient: Laval W Fehring  Procedure(s) Performed: Procedure(s) (LRB): SHOULDER ARTHROSCOPY WITH OPEN ROTATOR CUFF REPAIR (Right) SHOULDER ARTHROSCOPY WITH SUBACROMIAL DECOMPRESSION AND LIMITED DEBRIDEMENT (Right)  Patient location during evaluation: PACU Anesthesia Type: General Level of consciousness: awake and alert Pain management: pain level controlled Vital Signs Assessment: post-procedure vital signs reviewed and stable Respiratory status: spontaneous breathing, nonlabored ventilation, respiratory function stable and patient connected to nasal cannula oxygen Cardiovascular status: blood pressure returned to baseline and stable Postop Assessment: no signs of nausea or vomiting Anesthetic complications: no    Last Vitals:  Vitals:   05/13/16 1307 05/13/16 1400  BP: (!) 146/84 (!) 143/82  Pulse: 63 (!) 59  Resp: 15 15  Temp:      Last Pain:  Vitals:   05/14/16 1136  TempSrc:   PainSc: 0-No pain                 Yevette EdwardsJames G Adams

## 2016-05-18 DIAGNOSIS — M6281 Muscle weakness (generalized): Secondary | ICD-10-CM | POA: Diagnosis not present

## 2016-05-18 DIAGNOSIS — M25611 Stiffness of right shoulder, not elsewhere classified: Secondary | ICD-10-CM | POA: Diagnosis not present

## 2016-05-18 DIAGNOSIS — M25511 Pain in right shoulder: Secondary | ICD-10-CM | POA: Diagnosis not present

## 2016-05-18 DIAGNOSIS — Z9889 Other specified postprocedural states: Secondary | ICD-10-CM | POA: Diagnosis not present

## 2016-05-25 DIAGNOSIS — M25611 Stiffness of right shoulder, not elsewhere classified: Secondary | ICD-10-CM | POA: Diagnosis not present

## 2016-05-25 DIAGNOSIS — Z9889 Other specified postprocedural states: Secondary | ICD-10-CM | POA: Diagnosis not present

## 2016-05-25 DIAGNOSIS — M25511 Pain in right shoulder: Secondary | ICD-10-CM | POA: Diagnosis not present

## 2016-05-25 DIAGNOSIS — D8683 Sarcoid iridocyclitis: Secondary | ICD-10-CM | POA: Diagnosis not present

## 2016-06-02 DIAGNOSIS — Z9889 Other specified postprocedural states: Secondary | ICD-10-CM | POA: Diagnosis not present

## 2016-06-02 DIAGNOSIS — M25611 Stiffness of right shoulder, not elsewhere classified: Secondary | ICD-10-CM | POA: Diagnosis not present

## 2016-06-02 DIAGNOSIS — M25511 Pain in right shoulder: Secondary | ICD-10-CM | POA: Diagnosis not present

## 2016-06-15 DIAGNOSIS — D8683 Sarcoid iridocyclitis: Secondary | ICD-10-CM | POA: Diagnosis not present

## 2016-06-15 DIAGNOSIS — Z9889 Other specified postprocedural states: Secondary | ICD-10-CM | POA: Diagnosis not present

## 2016-06-15 DIAGNOSIS — M25511 Pain in right shoulder: Secondary | ICD-10-CM | POA: Diagnosis not present

## 2016-06-15 DIAGNOSIS — M25611 Stiffness of right shoulder, not elsewhere classified: Secondary | ICD-10-CM | POA: Diagnosis not present

## 2016-06-22 DIAGNOSIS — M25511 Pain in right shoulder: Secondary | ICD-10-CM | POA: Diagnosis not present

## 2016-06-22 DIAGNOSIS — M25611 Stiffness of right shoulder, not elsewhere classified: Secondary | ICD-10-CM | POA: Diagnosis not present

## 2016-06-22 DIAGNOSIS — Z9889 Other specified postprocedural states: Secondary | ICD-10-CM | POA: Diagnosis not present

## 2016-06-25 DIAGNOSIS — D8683 Sarcoid iridocyclitis: Secondary | ICD-10-CM | POA: Diagnosis not present

## 2016-06-28 DIAGNOSIS — M25611 Stiffness of right shoulder, not elsewhere classified: Secondary | ICD-10-CM | POA: Diagnosis not present

## 2016-06-28 DIAGNOSIS — M6281 Muscle weakness (generalized): Secondary | ICD-10-CM | POA: Diagnosis not present

## 2016-06-28 DIAGNOSIS — M25511 Pain in right shoulder: Secondary | ICD-10-CM | POA: Diagnosis not present

## 2016-06-28 DIAGNOSIS — Z9889 Other specified postprocedural states: Secondary | ICD-10-CM | POA: Diagnosis not present

## 2016-06-30 DIAGNOSIS — Z9889 Other specified postprocedural states: Secondary | ICD-10-CM | POA: Diagnosis not present

## 2016-06-30 DIAGNOSIS — M6281 Muscle weakness (generalized): Secondary | ICD-10-CM | POA: Diagnosis not present

## 2016-06-30 DIAGNOSIS — M25611 Stiffness of right shoulder, not elsewhere classified: Secondary | ICD-10-CM | POA: Diagnosis not present

## 2016-06-30 DIAGNOSIS — M25511 Pain in right shoulder: Secondary | ICD-10-CM | POA: Diagnosis not present

## 2016-07-06 DIAGNOSIS — M25611 Stiffness of right shoulder, not elsewhere classified: Secondary | ICD-10-CM | POA: Diagnosis not present

## 2016-07-06 DIAGNOSIS — M6281 Muscle weakness (generalized): Secondary | ICD-10-CM | POA: Diagnosis not present

## 2016-07-06 DIAGNOSIS — M25511 Pain in right shoulder: Secondary | ICD-10-CM | POA: Diagnosis not present

## 2016-07-06 DIAGNOSIS — Z9889 Other specified postprocedural states: Secondary | ICD-10-CM | POA: Diagnosis not present

## 2016-07-08 DIAGNOSIS — M25511 Pain in right shoulder: Secondary | ICD-10-CM | POA: Diagnosis not present

## 2016-07-08 DIAGNOSIS — M25611 Stiffness of right shoulder, not elsewhere classified: Secondary | ICD-10-CM | POA: Diagnosis not present

## 2016-07-08 DIAGNOSIS — M6281 Muscle weakness (generalized): Secondary | ICD-10-CM | POA: Diagnosis not present

## 2016-07-08 DIAGNOSIS — Z9889 Other specified postprocedural states: Secondary | ICD-10-CM | POA: Diagnosis not present

## 2016-07-12 DIAGNOSIS — H27132 Posterior dislocation of lens, left eye: Secondary | ICD-10-CM | POA: Diagnosis not present

## 2016-07-12 DIAGNOSIS — H59022 Cataract (lens) fragments in eye following cataract surgery, left eye: Secondary | ICD-10-CM | POA: Diagnosis not present

## 2016-07-12 DIAGNOSIS — T8522XA Displacement of intraocular lens, initial encounter: Secondary | ICD-10-CM | POA: Diagnosis not present

## 2016-07-14 DIAGNOSIS — M25511 Pain in right shoulder: Secondary | ICD-10-CM | POA: Diagnosis not present

## 2016-07-14 DIAGNOSIS — M6281 Muscle weakness (generalized): Secondary | ICD-10-CM | POA: Diagnosis not present

## 2016-07-14 DIAGNOSIS — Z9889 Other specified postprocedural states: Secondary | ICD-10-CM | POA: Diagnosis not present

## 2016-07-14 DIAGNOSIS — M25611 Stiffness of right shoulder, not elsewhere classified: Secondary | ICD-10-CM | POA: Diagnosis not present

## 2016-07-16 DIAGNOSIS — M6281 Muscle weakness (generalized): Secondary | ICD-10-CM | POA: Diagnosis not present

## 2016-07-16 DIAGNOSIS — Z9889 Other specified postprocedural states: Secondary | ICD-10-CM | POA: Diagnosis not present

## 2016-07-16 DIAGNOSIS — M25611 Stiffness of right shoulder, not elsewhere classified: Secondary | ICD-10-CM | POA: Diagnosis not present

## 2016-07-16 DIAGNOSIS — M25511 Pain in right shoulder: Secondary | ICD-10-CM | POA: Diagnosis not present

## 2016-07-20 DIAGNOSIS — M25511 Pain in right shoulder: Secondary | ICD-10-CM | POA: Diagnosis not present

## 2016-07-20 DIAGNOSIS — Z9889 Other specified postprocedural states: Secondary | ICD-10-CM | POA: Diagnosis not present

## 2016-07-20 DIAGNOSIS — M25611 Stiffness of right shoulder, not elsewhere classified: Secondary | ICD-10-CM | POA: Diagnosis not present

## 2016-07-20 DIAGNOSIS — M6281 Muscle weakness (generalized): Secondary | ICD-10-CM | POA: Diagnosis not present

## 2016-07-22 DIAGNOSIS — M6281 Muscle weakness (generalized): Secondary | ICD-10-CM | POA: Diagnosis not present

## 2016-07-22 DIAGNOSIS — M25611 Stiffness of right shoulder, not elsewhere classified: Secondary | ICD-10-CM | POA: Diagnosis not present

## 2016-07-22 DIAGNOSIS — Z9889 Other specified postprocedural states: Secondary | ICD-10-CM | POA: Diagnosis not present

## 2016-07-22 DIAGNOSIS — M25511 Pain in right shoulder: Secondary | ICD-10-CM | POA: Diagnosis not present

## 2016-07-27 DIAGNOSIS — M6281 Muscle weakness (generalized): Secondary | ICD-10-CM | POA: Diagnosis not present

## 2016-07-27 DIAGNOSIS — M25611 Stiffness of right shoulder, not elsewhere classified: Secondary | ICD-10-CM | POA: Diagnosis not present

## 2016-07-27 DIAGNOSIS — Z9889 Other specified postprocedural states: Secondary | ICD-10-CM | POA: Diagnosis not present

## 2016-07-27 DIAGNOSIS — M25511 Pain in right shoulder: Secondary | ICD-10-CM | POA: Diagnosis not present

## 2016-07-29 DIAGNOSIS — M25611 Stiffness of right shoulder, not elsewhere classified: Secondary | ICD-10-CM | POA: Diagnosis not present

## 2016-07-29 DIAGNOSIS — Z9889 Other specified postprocedural states: Secondary | ICD-10-CM | POA: Diagnosis not present

## 2016-07-29 DIAGNOSIS — M25511 Pain in right shoulder: Secondary | ICD-10-CM | POA: Diagnosis not present

## 2016-07-29 DIAGNOSIS — M6281 Muscle weakness (generalized): Secondary | ICD-10-CM | POA: Diagnosis not present

## 2016-08-03 DIAGNOSIS — Z9889 Other specified postprocedural states: Secondary | ICD-10-CM | POA: Diagnosis not present

## 2016-08-03 DIAGNOSIS — M25511 Pain in right shoulder: Secondary | ICD-10-CM | POA: Diagnosis not present

## 2016-08-03 DIAGNOSIS — M6281 Muscle weakness (generalized): Secondary | ICD-10-CM | POA: Diagnosis not present

## 2016-08-03 DIAGNOSIS — M25611 Stiffness of right shoulder, not elsewhere classified: Secondary | ICD-10-CM | POA: Diagnosis not present

## 2016-08-05 DIAGNOSIS — Z9889 Other specified postprocedural states: Secondary | ICD-10-CM | POA: Diagnosis not present

## 2016-08-05 DIAGNOSIS — M25611 Stiffness of right shoulder, not elsewhere classified: Secondary | ICD-10-CM | POA: Diagnosis not present

## 2016-08-05 DIAGNOSIS — M6281 Muscle weakness (generalized): Secondary | ICD-10-CM | POA: Diagnosis not present

## 2016-08-05 DIAGNOSIS — M25511 Pain in right shoulder: Secondary | ICD-10-CM | POA: Diagnosis not present

## 2016-08-10 DIAGNOSIS — M6281 Muscle weakness (generalized): Secondary | ICD-10-CM | POA: Diagnosis not present

## 2016-08-10 DIAGNOSIS — Z9889 Other specified postprocedural states: Secondary | ICD-10-CM | POA: Diagnosis not present

## 2016-08-10 DIAGNOSIS — M25611 Stiffness of right shoulder, not elsewhere classified: Secondary | ICD-10-CM | POA: Diagnosis not present

## 2016-08-10 DIAGNOSIS — M25511 Pain in right shoulder: Secondary | ICD-10-CM | POA: Diagnosis not present

## 2016-08-18 DIAGNOSIS — M19011 Primary osteoarthritis, right shoulder: Secondary | ICD-10-CM | POA: Diagnosis not present

## 2016-08-18 DIAGNOSIS — M75121 Complete rotator cuff tear or rupture of right shoulder, not specified as traumatic: Secondary | ICD-10-CM | POA: Diagnosis not present

## 2016-08-31 DIAGNOSIS — D8683 Sarcoid iridocyclitis: Secondary | ICD-10-CM | POA: Diagnosis not present

## 2016-10-26 ENCOUNTER — Ambulatory Visit
Admission: EM | Admit: 2016-10-26 | Discharge: 2016-10-26 | Disposition: A | Discharge status: Home / Self Care | Payer: PPO | Attending: Family Medicine | Admitting: Family Medicine

## 2016-10-26 DIAGNOSIS — J301 Allergic rhinitis due to pollen: Secondary | ICD-10-CM

## 2016-10-26 MED ORDER — HYDROCOD POLST-CPM POLST ER 10-8 MG/5ML PO SUER: 5.0000 mL | Freq: Two times a day (BID) | ORAL | 0 refills | Status: DC

## 2016-10-26 MED ORDER — CETIRIZINE-PSEUDOEPHEDRINE ER 5-120 MG PO TB12: 1.0000 | ORAL_TABLET | Freq: Two times a day (BID) | ORAL | 0 refills | Status: DC

## 2016-10-26 MED ORDER — FLUTICASONE PROPIONATE 50 MCG/ACT NA SUSP: 2.0000 | Freq: Every day | NASAL | 0 refills | Status: DC

## 2016-10-26 NOTE — ED Triage Notes (Signed)
Pt has tried Tylenol for ear pain/headache/ neck pain, with some relief at onset 3-4 days. Pt reports the tylenol is ineffective today. Pt also is c/o nasal congestion and some nighttime cough productive of some sputum.

## 2016-10-26 NOTE — ED Provider Notes (Signed)
CSN: [id number]     Arrival date & time 10/26/16  1629 History   First MD Initiated Contact with Patient 10/26/16 1825     Chief Complaint  Patient presents with  . Headache   (Consider location/radiation/quality/duration/timing/severity/associated sxs/prior Treatment) HPI  This a 71 year old male who presents with ear pain headache with nasal congestion and nighttime cough with production of some sputum. He does not have any facial pain. He does have postnasal drip more than out of his nose. He has no fever or chills.      Past Medical History:  Diagnosis Date  . Sarcoidosis 1997   PT DENIES ANY ACTIVE PROBLEMS-ASYMPTOMATIC   Past Surgical History:  Procedure Laterality Date  . BUNIONECTOMY Bilateral   . HAND SURGERY    . KNEE SURGERY Bilateral   . SHOULDER ARTHROSCOPY WITH OPEN ROTATOR CUFF REPAIR Right 05/13/2016   Procedure: SHOULDER ARTHROSCOPY WITH OPEN ROTATOR CUFF REPAIR;  Surgeon: Christena FlakeJohn J Poggi, MD;  Location: [facility] ORS;  Service: Orthopedics;  Laterality: Right;  . SHOULDER ARTHROSCOPY WITH SUBACROMIAL DECOMPRESSION Right 05/13/2016   Procedure: SHOULDER ARTHROSCOPY WITH SUBACROMIAL DECOMPRESSION AND LIMITED DEBRIDEMENT;  Surgeon: Christena FlakeJohn J Poggi, MD;  Location: [facility] ORS;  Service: Orthopedics;  Laterality: Right;   Family History  Problem Relation Age of Onset  . Hypertension Mother   . Hypertension Sister    Social History  Substance Use Topics  . Smoking status: Never Smoker  . Smokeless tobacco: Never Used  . Alcohol use 0.0 oz/week     Comment: occasional    Review of Systems  Constitutional: Positive for activity change. Negative for chills, fatigue and fever.  HENT: Positive for congestion, postnasal drip, rhinorrhea, sinus pain and sinus pressure.   Respiratory: Positive for cough. Negative for shortness of breath and wheezing.   All other systems reviewed and are negative.   Allergies  Lodine [etodolac]  Home Medications   Prior to Admission  medications   Medication Sig Start Date End Date Taking? Authorizing Provider  acetaminophen (TYLENOL) 500 MG chewable tablet Chew 500 mg by mouth every 6 (six) hours as needed for pain.   Yes [provider]  aspirin 81 MG chewable tablet Chew 81 mg by mouth daily.   Yes [provider]  Multiple Vitamin (MULTIVITAMIN WITH MINERALS) TABS tablet Take 1 tablet by mouth daily.   Yes [provider]  vitamin C (ASCORBIC ACID) 500 MG tablet Take 500 mg by mouth daily.   Yes [provider]  cetirizine-pseudoephedrine (ZYRTEC-D) 5-120 MG tablet Take 1 tablet by mouth 2 (two) times daily. 10/26/16   Lutricia Feiloemer, Ebonie Westerlund P, PA-C  chlorpheniramine-HYDROcodone (TUSSIONEX PENNKINETIC ER) 10-8 MG/5ML SUER Take 5 mLs by mouth 2 (two) times daily. 10/26/16   Lutricia Feiloemer, Aoi Kouns P, PA-C  fluticasone (FLONASE) 50 MCG/ACT nasal spray Place 2 sprays into both nostrils daily. 10/26/16   Lutricia Feiloemer, Johnatha Zeidman P, PA-C  oxyCODONE (ROXICODONE) 5 MG immediate release tablet Take 1-2 tablets (5-10 mg total) by mouth every 4 (four) hours as needed for severe pain. 05/13/16   Poggi, Excell SeltzerJohn J, MD   Meds Ordered and Administered this Visit  Medications - No data to display  BP (!) 146/94 (BP Location: Right Arm)   Pulse (!) 59   Temp 98.4 F (36.9 C) (Oral)   Ht 5\' 9"  (1.753 m)   Wt 173 lb (78.5 kg)   SpO2 100%   BMI 25.55 kg/m  No data found.   Physical Exam  Constitutional: He is oriented to person,  place, and time. He appears well-developed and well-nourished. No distress.  HENT:  Head: Normocephalic.  Nose: Nose normal.  Mouth/Throat: Oropharynx is clear and moist. No oropharyngeal exudate.  Both ear canals are occluded with cerumen  Eyes: Pupils are equal, round, and reactive to light. Right eye exhibits no discharge. Left eye exhibits no discharge.  Neck: Normal range of motion.  Pulmonary/Chest: Effort normal and breath sounds normal. No respiratory distress. He has no wheezes. He has no  rales.  Musculoskeletal: Normal range of motion.  Lymphadenopathy:    He has no cervical adenopathy.  Neurological: He is alert and oriented to person, place, and time.  Skin: Skin is warm and dry. He is not diaphoretic.  Psychiatric: He has a normal mood and affect. His behavior is normal. Judgment and thought content normal.  Nursing note and vitals reviewed.   Urgent Care Course     Procedures (including critical care time)  Labs Review Labs Reviewed - No data to display  Imaging Review No results found.   Visual Acuity Review  Right Eye Distance:   Left Eye Distance:   Bilateral Distance:    Right Eye Near:   Left Eye Near:    Bilateral Near:         MDM   1. Seasonal allergic rhinitis due to pollen    Discharge Medication List as of 10/26/2016  6:38 PM    START taking these medications   Details  cetirizine-pseudoephedrine (ZYRTEC-D) 5-120 MG tablet Take 1 tablet by mouth 2 (two) times daily., Starting Tue 10/26/2016, Normal    chlorpheniramine-HYDROcodone (TUSSIONEX PENNKINETIC ER) 10-8 MG/5ML SUER Take 5 mLs by mouth 2 (two) times daily., Starting Tue 10/26/2016, Print    fluticasone (FLONASE) 50 MCG/ACT nasal spray Place 2 sprays into both nostrils daily., Starting Tue 10/26/2016, Normal      Plan: 1. Test/x-ray results and diagnosis reviewed with patient 2. rx as per orders; risks, benefits, potential side effects reviewed with patient 3. Recommend supportive treatment with Switching to Zyrtec D from Zyrtec for about 1 week. Then resume taking Zyrtec. Flonase daily for of the pollen season. I will give him Tussionex cough syrup so he is able to sleep at night and not disturb his wife. It is not improving should follow-up with his primary care physician. 4. F/u prn if symptoms worsen or don't improve     Lutricia Feil, PA-C 10/26/16 1846

## 2016-11-15 DIAGNOSIS — S83262A Peripheral tear of lateral meniscus, current injury, left knee, initial encounter: Secondary | ICD-10-CM | POA: Diagnosis not present

## 2016-12-09 DIAGNOSIS — Z0000 Encounter for general adult medical examination without abnormal findings: Secondary | ICD-10-CM | POA: Diagnosis not present

## 2016-12-09 DIAGNOSIS — Z125 Encounter for screening for malignant neoplasm of prostate: Secondary | ICD-10-CM | POA: Diagnosis not present

## 2016-12-09 DIAGNOSIS — Z79899 Other long term (current) drug therapy: Secondary | ICD-10-CM | POA: Diagnosis not present

## 2016-12-09 DIAGNOSIS — N529 Male erectile dysfunction, unspecified: Secondary | ICD-10-CM | POA: Diagnosis not present

## 2017-02-01 DIAGNOSIS — D8683 Sarcoid iridocyclitis: Secondary | ICD-10-CM | POA: Diagnosis not present

## 2017-02-17 DIAGNOSIS — H2000 Unspecified acute and subacute iridocyclitis: Secondary | ICD-10-CM | POA: Diagnosis not present

## 2018-01-03 ENCOUNTER — Encounter: Payer: Self-pay | Admitting: Emergency Medicine

## 2018-01-03 ENCOUNTER — Other Ambulatory Visit: Payer: Self-pay

## 2018-01-03 ENCOUNTER — Ambulatory Visit
Admission: EM | Admit: 2018-01-03 | Discharge: 2018-01-03 | Disposition: A | Discharge status: Home / Self Care | Payer: Medicare HMO | Attending: Family Medicine | Admitting: Family Medicine

## 2018-01-03 DIAGNOSIS — J069 Acute upper respiratory infection, unspecified: Secondary | ICD-10-CM | POA: Diagnosis not present

## 2018-01-03 DIAGNOSIS — H6123 Impacted cerumen, bilateral: Secondary | ICD-10-CM | POA: Diagnosis not present

## 2018-01-03 DIAGNOSIS — R0981 Nasal congestion: Secondary | ICD-10-CM | POA: Diagnosis not present

## 2018-01-03 MED ORDER — BENZONATATE 100 MG PO CAPS: 100.0000 mg | ORAL_CAPSULE | Freq: Three times a day (TID) | ORAL | 0 refills | Status: DC | PRN

## 2018-01-03 MED ORDER — HYDROCOD POLST-CPM POLST ER 10-8 MG/5ML PO SUER: 5.0000 mL | Freq: Every evening | ORAL | 0 refills | Status: DC | PRN

## 2018-01-03 NOTE — ED Provider Notes (Signed)
[facility] ____________________________________________  Time seen: Approximately 1:32 PM  I have reviewed the triage vital signs and the nursing notes.   HISTORY  Chief Complaint Otalgia and Sinus Problem  HPI Gabriel Decker is a 72 y.o. male presenting for evaluation of 3 days of runny nose, nasal congestion, sinus pressure and cough.  States cough has been disrupting his sleep.  States that he feels like his Tylenol sinus pressure sensation in his cheeks and had a lot of nasal drainage.  Denies fevers.  Reports has continued to overall eat and drink well.  Denies chest pain, shortness of breath, abdominal pain.  States symptoms unresolved with over-the-counter allergy medication.  Denies other relieving factors.  Denies known sick contacts.  Reports otherwise feels well.  Margorie John, MD: PCP   Past Medical History:  Diagnosis Date  . Sarcoidosis 1997   PT DENIES ANY ACTIVE PROBLEMS-ASYMPTOMATIC    There are no active problems to display for this patient.   Past Surgical History:  Procedure Laterality Date  . BUNIONECTOMY Bilateral   . HAND SURGERY    . KNEE SURGERY Bilateral   . SHOULDER ARTHROSCOPY WITH OPEN ROTATOR CUFF REPAIR Right 05/13/2016   Procedure: SHOULDER ARTHROSCOPY WITH OPEN ROTATOR CUFF REPAIR;  Surgeon: Christena Flake, MD;  Location: [facility] OR[facility];  Service: Orthopedics;  Laterality: Right;  . SHOULDER ARTHROSCOPY WITH SUBACROMIAL DECOMPRESSION Right 05/13/2016   Procedure: SHOULDER ARTHROSCOPY WITH SUBACROMIAL DECOMPRESSION AND LIMITED DEBRIDEMENT;  Surgeon: Christena Flake, MD;  Location: [facility] OR[facility];  Service: Orthopedics;  Laterality: Right;     No current facility-administered medications for this encounter.   Current Outpatient Medications:  .  aspirin 81 MG chewable tablet, Chew 81 mg by mouth daily., Disp: , Rfl:  .  cetirizine-pseudoephedrine (ZYRTEC-D) 5-120 MG tablet, Take 1 tablet by mouth 2 (two) times daily., Disp: 15 tablet, Rfl:  0 .  Multiple Vitamin (MULTIVITAMIN WITH MINERALS) TABS tablet, Take 1 tablet by mouth daily., Disp: , Rfl:  .  tadalafil (CIALIS) 20 MG tablet, Take by mouth., Disp: , Rfl:  .  acetaminophen (TYLENOL) 500 MG chewable tablet, Chew 500 mg by mouth every 6 (six) hours as needed for pain., Disp: , Rfl:  .  benzonatate (TESSALON PERLES) 100 MG capsule, Take 1 capsule (100 mg total) by mouth 3 (three) times daily as needed for cough., Disp: 15 capsule, Rfl: 0 .  chlorpheniramine-HYDROcodone (TUSSIONEX PENNKINETIC ER) 10-8 MG/5ML SUER, Take 5 mLs by mouth at bedtime as needed for cough. do not drive or operate machinery while taking as can cause drowsiness., Disp: 50 mL, Rfl: 0 .  vitamin C (ASCORBIC ACID) 500 MG tablet, Take 500 mg by mouth daily., Disp: , Rfl:   Allergies Lodine [etodolac]  Family History  Problem Relation Age of Onset  . Hypertension Mother   . Hypertension Sister     Social History Social History   Tobacco Use  . Smoking status: Never Smoker  . Smokeless tobacco: Never Used  Substance Use Topics  . Alcohol use: Yes    Alcohol/week: 0.0 oz    Comment: occasional  . Drug use: No    Review of Systems Constitutional: No fever ENT: No sore throat. Cardiovascular: Denies chest pain. Respiratory: Denies shortness of breath. Gastrointestinal: No abdominal pain.   Musculoskeletal: Negative for back pain. Skin: Negative for rash.  ____________________________________________   PHYSICAL EXAM:  VITAL SIGNS: ED Triage Vitals  Enc Vitals Group     BP 01/03/18 1225 (!) 136/98  Pulse Rate 01/03/18 1225 66     Resp 01/03/18 1225 16     Temp 01/03/18 1225 98.2 F (36.8 C)     Temp Source 01/03/18 1225 Oral     SpO2 01/03/18 1225 100 %     Weight 01/03/18 1222 172 lb (78 kg)     Height 01/03/18 1222 5\' 9"  (1.753 m)     Head Circumference --      Peak Flow --      Pain Score 01/03/18 1222 6     Pain Loc --      Pain Edu? --      Excl. in GC? --      Constitutional: Alert and oriented. Well appearing and in no acute distress. Eyes: Conjunctivae are normal.  Head: Atraumatic. No sinus tenderness to palpation. No swelling. No erythema.  Ears: Bilateral total cerumen impaction.  Post cerumen removal.  Left nontender, normal canal, no erythema, normal TM.  Right: : Nontender, minimal erythema in canal, normal TM, no erythema.  Nose:Nasal congestion with clear rhinorrhea  Mouth/Throat: Mucous membranes are moist. No pharyngeal erythema. No tonsillar swelling or exudate.  Neck: No stridor.  No cervical spine tenderness to palpation. Hematological/Lymphatic/Immunilogical: No cervical lymphadenopathy. Cardiovascular: Normal rate, regular rhythm. Grossly normal heart sounds.  Good peripheral circulation. Respiratory: Normal respiratory effort. No retractions. No wheezes, rales or rhonchi. Good air movement.  Musculoskeletal: Ambulatory with steady gait.  Neurologic:  Normal speech and language. No gait instability. Skin:  Skin appears warm, dry and intact. No rash noted. Psychiatric: Mood and affect are normal. Speech and behavior are normal.   ___________________________________________   LABS (all labs ordered are listed, but only abnormal results are displayed)  Labs Reviewed - No data to display   PROCEDURES Procedures   Total cerumen impaction bilaterally.  Remove by irrigation by CMA.  Patient tolerated well.  Canals clear post irrigation.  INITIAL IMPRESSION / ASSESSMENT AND PLAN / ED COURSE  Pertinent labs & imaging results that were available during my care of the patient were reviewed by me and considered in my medical decision making (see chart for details).  Well-appearing patient.  No acute distress.  Suspect viral upper respiratory infection.  Encourage rest, fluids, supportive care, PRN Tussionex at night and PRN Tessalon Perles during the day and continue oral antihistamine.Discussed indication, risks and benefits of  medications with patient.  Discussed follow up with Primary care physician this week. Discussed follow up and return parameters including no resolution or any worsening concerns. Patient verbalized understanding and agreed to plan.   ____________________________________________   FINAL CLINICAL IMPRESSION(S) / ED DIAGNOSES  Final diagnoses:  Upper respiratory tract infection, unspecified type  Nasal congestion  Bilateral impacted cerumen     ED Discharge Orders        Ordered    chlorpheniramine-HYDROcodone (TUSSIONEX PENNKINETIC ER) 10-8 MG/5ML SUER  At bedtime PRN     01/03/18 1302    benzonatate (TESSALON PERLES) 100 MG capsule  3 times daily PRN     01/03/18 1302       Note: This dictation was prepared with Dragon dictation along with smaller phrase technology. Any transcriptional errors that result from this process are unintentional.         Renford DillsMiller, Tevan Marian, NP 01/03/18 1335

## 2018-01-03 NOTE — ED Triage Notes (Signed)
Patient c/o pain in both ears, sinus congestion and pressure and HAs for the past 3 days.

## 2018-01-03 NOTE — Discharge Instructions (Addendum)
Take medication as prescribed. Rest. Drink plenty of fluids.  ° °Follow up with your primary care physician this week as needed. Return to Urgent care for new or worsening concerns.  ° °

## 2018-10-11 ENCOUNTER — Other Ambulatory Visit: Payer: Self-pay | Admitting: Surgery

## 2018-10-11 DIAGNOSIS — M4722 Other spondylosis with radiculopathy, cervical region: Secondary | ICD-10-CM

## 2018-10-11 DIAGNOSIS — M5412 Radiculopathy, cervical region: Secondary | ICD-10-CM

## 2018-11-16 ENCOUNTER — Ambulatory Visit
Admission: RE | Admit: 2018-11-16 | Discharge: 2018-11-16 | Disposition: A | Discharge status: Home / Self Care | Payer: Medicare HMO | Source: Ambulatory Visit | Attending: Surgery | Admitting: Surgery

## 2018-11-16 ENCOUNTER — Other Ambulatory Visit: Payer: Self-pay

## 2018-11-16 DIAGNOSIS — M5412 Radiculopathy, cervical region: Secondary | ICD-10-CM | POA: Diagnosis present

## 2018-11-16 DIAGNOSIS — M4722 Other spondylosis with radiculopathy, cervical region: Secondary | ICD-10-CM | POA: Insufficient documentation

## 2019-03-20 ENCOUNTER — Encounter: Payer: Self-pay | Admitting: Emergency Medicine

## 2019-03-20 ENCOUNTER — Other Ambulatory Visit: Payer: Self-pay

## 2019-03-20 ENCOUNTER — Ambulatory Visit
Admission: EM | Admit: 2019-03-20 | Discharge: 2019-03-20 | Disposition: A | Discharge status: Home / Self Care | Payer: Medicare HMO

## 2019-03-20 DIAGNOSIS — R040 Epistaxis: Secondary | ICD-10-CM

## 2019-03-20 DIAGNOSIS — J301 Allergic rhinitis due to pollen: Secondary | ICD-10-CM

## 2019-03-20 HISTORY — DX: Other seasonal allergic rhinitis: J30.2

## 2019-03-20 HISTORY — DX: Male erectile dysfunction, unspecified: N52.9

## 2019-03-20 MED ORDER — IPRATROPIUM BROMIDE 0.06 % NA SOLN: 2.0000 | Freq: Four times a day (QID) | NASAL | 0 refills | Status: AC | PRN

## 2019-03-20 NOTE — Discharge Instructions (Signed)
It was very nice seeing you today in clinic. Thank you for entrusting me with your care.   Use nasal spray as needed. Consider a humidifier. Continue loratadine.   Make arrangements to follow up with your regular doctor in 1 week for re-evaluation if not improving. If your symptoms/condition worsens, please seek follow up care either here or in the ER. Please remember, our [place] providers are "right here with you" when you need [place].   Again, it was my pleasure to take care of you today. Thank you for choosing our clinic. I hope that you start to feel better quickly.   Honor Loh, MSN, APRN, FNP-C, CEN Advanced Practice Provider [facility]

## 2019-03-20 NOTE — ED Triage Notes (Signed)
Patient in today c/o sinus problem. He states he had walked across the yard and later started to have sinus issue and produced some phlegm that was bloody.

## 2019-03-20 NOTE — ED Provider Notes (Signed)
[place], [place]   Name: Gabriel Decker DOB: 1946-02-01 MRN: [id number] CSN: [id number] PCP: Shon Hough, MD  Arrival date and time:  03/20/19 1901  Chief Complaint:  Sinus Problem   NOTE: Prior to seeing the patient today, I have reviewed the triage nursing documentation and vital signs. Clinical staff has updated patient's PMH/PSHx, current medication list, and drug allergies/intolerances to ensure comprehensive history available to assist in medical decision making.   History:   HPI: Gabriel Decker is a 73 y.o. male who presents today with complaints of sinus problems. Patient reports that his allergies have been bothering him after being outside more today. Patient has experienced increased rhinorrhea (clear). He appreciated the sensation of something being stuck in his LEFT nare. Patient states, "I sucked in really hard and the mucous came out of my nose.  When I spit it out, there was blood and it worried me".  PMH positive for seasonal allergies.  Patient had similar symptoms last year and was seen by ENT for laryngoscopic examination.  He notes that they did not find anything of concern and told him to use daily allergy medication.  Patient currently taking loratadine on a daily basis.  He denies any other symptoms; no cough, sore throat, facial pain, headache, ear pain. Patient denies being in close contact with anyone known to be ill. He has never been tested for SARS-CoV-2 (novel coronavirus) per his report, and does not wish to be tested today.  Past Medical History:  Diagnosis Date  . ED (erectile dysfunction)   . Sarcoidosis 1997   PT DENIES ANY ACTIVE PROBLEMS-ASYMPTOMATIC  . Seasonal allergies     Past Surgical History:  Procedure Laterality Date  . BUNIONECTOMY Bilateral   . HAND SURGERY    . KNEE SURGERY Bilateral   . SHOULDER ARTHROSCOPY WITH OPEN ROTATOR CUFF REPAIR Right 05/13/2016   Procedure: SHOULDER ARTHROSCOPY WITH OPEN ROTATOR CUFF REPAIR;  Surgeon:  Corky Mull, MD;  Location: [facility] ORS;  Service: Orthopedics;  Laterality: Right;  . SHOULDER ARTHROSCOPY WITH SUBACROMIAL DECOMPRESSION Right 05/13/2016   Procedure: SHOULDER ARTHROSCOPY WITH SUBACROMIAL DECOMPRESSION AND LIMITED DEBRIDEMENT;  Surgeon: Corky Mull, MD;  Location: [facility] OR[facility];  Service: Orthopedics;  Laterality: Right;    Family History  Problem Relation Age of Onset  . Hypertension Mother   . Heart attack Mother        84  . Other Father 58       MVA  . Hypertension Sister     Social History   Tobacco Use  . Smoking status: Never Smoker  . Smokeless tobacco: Never Used  Substance Use Topics  . Alcohol use: Yes    Alcohol/week: 0.0 standard drinks    Comment: occasional  . Drug use: No    There are no active problems to display for this patient.   Home Medications:    Current Meds  Medication Sig  . acetaminophen (TYLENOL) 500 MG chewable tablet Chew 500 mg by mouth every 6 (six) hours as needed for pain.  Marland Kitchen aspirin 81 MG chewable tablet Chew 81 mg by mouth daily.  Marland Kitchen loratadine (CLARITIN) 10 MG tablet Take 10 mg by mouth daily.  . Multiple Vitamin (MULTIVITAMIN WITH MINERALS) TABS tablet Take 1 tablet by mouth daily.  . tadalafil (CIALIS) 20 MG tablet Take by mouth.  . vitamin C (ASCORBIC ACID) 500 MG tablet Take 500 mg by mouth daily.    Allergies:   Lodine [etodolac]  Review of Systems (  ROS): Review of Systems  Constitutional: Negative for fatigue and fever.  HENT: Positive for congestion and rhinorrhea. Negative for ear pain, postnasal drip, sinus pressure, sinus pain, sneezing and sore throat.        Blood-tinged nasal mucus x1 episode  Eyes: Positive for itching. Negative for pain, discharge and redness.  Respiratory: Negative for cough, chest tightness and shortness of breath.   Cardiovascular: Negative for chest pain and palpitations.  Gastrointestinal: Negative for abdominal pain, diarrhea, nausea and vomiting.  Musculoskeletal: Negative for  arthralgias, back pain, myalgias and neck pain.  Skin: Negative for color change, pallor and rash.  Neurological: Negative for dizziness, syncope, weakness and headaches.  Hematological: Negative for adenopathy.     Vital Signs: Today's Vitals   03/20/19 1912 03/20/19 1913 03/20/19 1935  BP:  (!) 143/91   Pulse:  69   Resp:  18   Temp:  98.2 F (36.8 C)   TempSrc:  Oral   SpO2:  100%   Weight:  170 lb (77.1 kg)   Height:  5\' 9"  (1.753 m)   PainSc: 0-No pain  0-No pain    Physical Exam: Physical Exam  Constitutional: He is oriented to person, place, and time and well-developed, well-nourished, and in no distress. No distress.  HENT:  Head: Normocephalic and atraumatic.  Right Ear: Hearing and tympanic membrane normal.  Left Ear: Hearing and tympanic membrane normal.  Nose: Mucosal edema and rhinorrhea present. Epistaxis (stimata of bleeding 2/2 broken capillary (anterior) and LEFT nare) is observed.  Mouth/Throat: Uvula is midline. Posterior oropharyngeal erythema (+) clear PND present. No posterior oropharyngeal edema.  Eyes: Pupils are equal, round, and reactive to light. Conjunctivae and EOM are normal.  Neck: Normal range of motion. Neck supple.  Cardiovascular: Normal rate, regular rhythm, normal heart sounds and intact distal pulses.  Pulmonary/Chest: Effort normal. No respiratory distress. He has no decreased breath sounds. He has no wheezes. He has no rhonchi. He has no rales.  Musculoskeletal: Normal range of motion.  Neurological: He is alert and oriented to person, place, and time. Gait normal. GCS score is 15.  Skin: Skin is warm and dry. No rash noted. He is not diaphoretic.  Psychiatric: Mood, memory, affect and judgment normal.  Nursing note and vitals reviewed.   Urgent Care Treatments / Results:   LABS: PLEASE NOTE: all labs that were ordered this encounter are listed, however only abnormal results are displayed. Labs Reviewed - No data to display  EKG:  -None  RADIOLOGY: No results found.  PROCEDURES: Procedures  MEDICATIONS RECEIVED THIS VISIT: Medications - No data to display  PERTINENT CLINICAL COURSE NOTES/UPDATES:   Initial Impression / Assessment and Plan / Urgent Care Course:  Pertinent labs & imaging results that were available during my care of the patient were personally reviewed by me and considered in my medical decision making (see lab/imaging section of note for values and interpretations).  RIAZ ONORATO is a 74 y.o. male who presents to [facility] today with complaints of Sinus Problem   Patient is well appearing overall in clinic today. He does not appear to be in any acute distress. Presenting symptoms (see HPI) and exam as documented above.  Patient does not have any symptoms concerning for respiratory infection; no cough, sore throat, ear pain, or paranasal sinus tenderness.  Exam consistent with broken capillaries of the nasal mucosa located just inside the LEFT nare.  Patient has been having increased sinus symptoms today after being outside  more.  He has taken his prescribed loratadine, however notes that his symptoms have been a little worse.  Discussed use of fluticasone to help with the symptoms, however patient refuses citing that he is used this in the past and has caused significant nasal bleeding.  Discussed alternative treatment options.  Will send in prescription for ipratropium nasal spray for as needed use up to 4 times daily.  Patient is also using a dry heat source, which can make his nasal mucosa dry and friable.  Discussed that patient may find benefit in using a coolmist humidifier and saline nasal spray.  Discussed follow up with primary care physician in 1 week for re-evaluation. I have reviewed the follow up and strict return precautions for any new or worsening symptoms. Patient is aware of symptoms that would be deemed urgent/emergent, and would thus require further evaluation either here  or in the emergency department. At the time of discharge, he verbalized understanding and consent with the discharge plan as it was reviewed with him. All questions were fielded by provider and/or clinic staff prior to patient discharge.    Final Clinical Impressions / Urgent Care Diagnoses:   Final diagnoses:  Allergic rhinitis due to pollen, unspecified seasonality  Bleeding from the nose    New Prescriptions:  [place] Controlled Substance Registry consulted? Not Applicable  Meds ordered this encounter  Medications  . ipratropium (ATROVENT) 0.06 % nasal spray    Sig: Place 2 sprays into both nostrils 4 (four) times daily as needed for rhinitis.    Dispense:  15 mL    Refill:  0    Recommended Follow up Care:  Patient encouraged to follow up with the following provider within the specified time frame, or sooner as dictated by the severity of his symptoms. As always, he was instructed that for any urgent/emergent care needs, he should seek care either here or in the emergency department for more immediate evaluation.  Follow-up Information    Margorie Johnamsey, Colleen, MD In 1 week.   Specialty: Internal Medicine Why: General reassessment of symptoms if not improving        NOTE: This note was prepared using Dragon dictation software along with smaller phrase technology. Despite my best ability to proofread, there is the potential that transcriptional errors may still occur from this process, and are completely unintentional.    Verlee MonteGray, Donnamaria Shands E, NP 03/21/19 1605

## 2021-09-16 ENCOUNTER — Other Ambulatory Visit: Payer: Self-pay | Admitting: Orthopedic Surgery

## 2021-09-16 DIAGNOSIS — M4722 Other spondylosis with radiculopathy, cervical region: Secondary | ICD-10-CM

## 2021-09-16 DIAGNOSIS — M542 Cervicalgia: Secondary | ICD-10-CM

## 2021-09-16 DIAGNOSIS — M4802 Spinal stenosis, cervical region: Secondary | ICD-10-CM

## 2021-09-22 ENCOUNTER — Ambulatory Visit
Admission: RE | Admit: 2021-09-22 | Discharge: 2021-09-22 | Disposition: A | Discharge status: Home / Self Care | Payer: Medicare PPO | Source: Ambulatory Visit | Attending: Orthopedic Surgery | Admitting: Orthopedic Surgery

## 2021-09-22 DIAGNOSIS — M4722 Other spondylosis with radiculopathy, cervical region: Secondary | ICD-10-CM | POA: Insufficient documentation

## 2021-09-22 DIAGNOSIS — M542 Cervicalgia: Secondary | ICD-10-CM | POA: Insufficient documentation

## 2021-09-22 DIAGNOSIS — M4802 Spinal stenosis, cervical region: Secondary | ICD-10-CM | POA: Insufficient documentation

## 2023-03-14 ENCOUNTER — Other Ambulatory Visit: Payer: Self-pay | Admitting: Neurosurgery

## 2023-03-14 DIAGNOSIS — M542 Cervicalgia: Secondary | ICD-10-CM

## 2023-03-18 ENCOUNTER — Ambulatory Visit: Admission: RE | Admit: 2023-03-18 | Payer: Medicare HMO | Source: Ambulatory Visit

## 2023-07-31 IMAGING — MR MR CERVICAL SPINE W/O CM
5 series · 38 of 48 positions shown · non-contrast
Comparison: 11/16/2018

CLINICAL DATA: Neck pain, extending into right arm and fingertips

EXAM:
MRI CERVICAL SPINE WITHOUT CONTRAST
TECHNIQUE: Multiplanar, multisequence MR imaging of the cervical spine was
performed. No intravenous contrast was administered.

[Series 5: T2 · sagittal · 3.0mm · 0.62mm/px · 6 of 15 slices shown (1 of 2)]
[im 1/15]
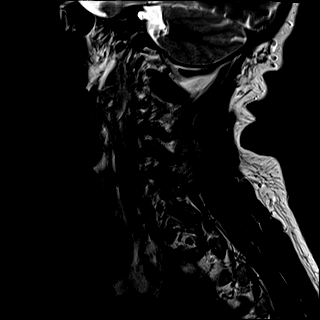
[im 3/15]
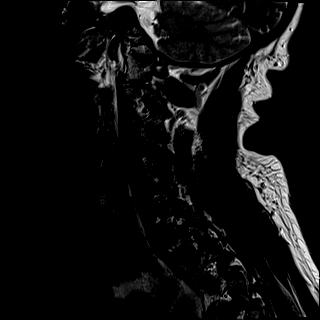
[im 6/15]
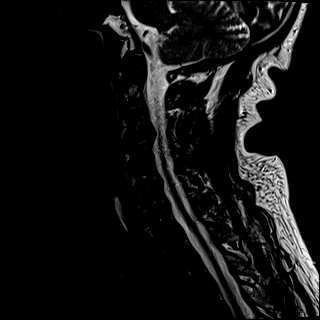
[im 9/15]
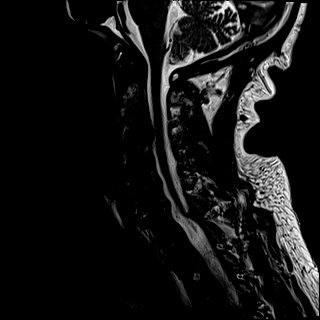
[im 12/15]
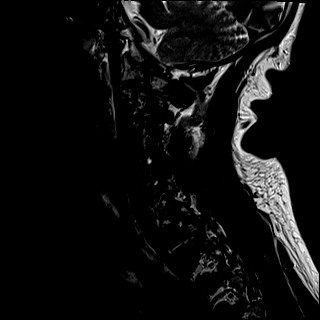
[im 15/15]
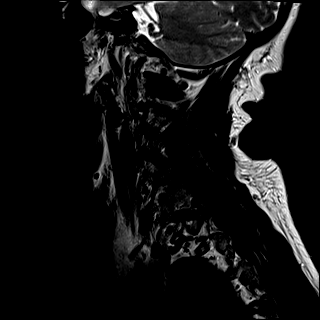

[Series 6: FLAIR · sagittal · 3.0mm · 0.78mm/px · 7 of 15 slices shown]
[im 1/15]
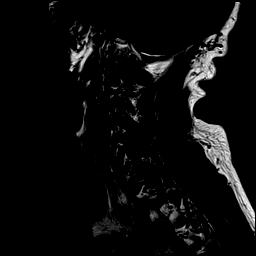
[im 3/15]
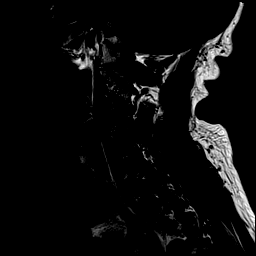
[im 5/15]
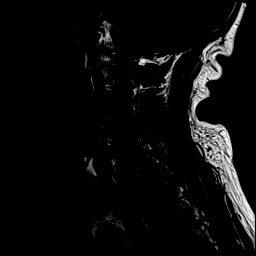
[im 8/15]
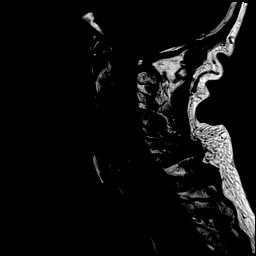
[im 10/15]
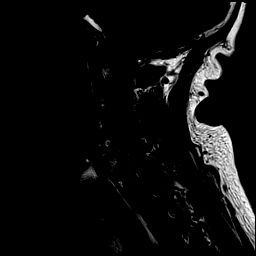
[im 12/15]
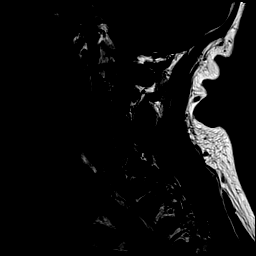
[im 15/15]
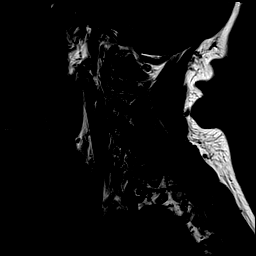

[Series 7: STIR · sagittal · 3.0mm · 0.62mm/px · 7 of 15 slices shown]
[im 1/15]
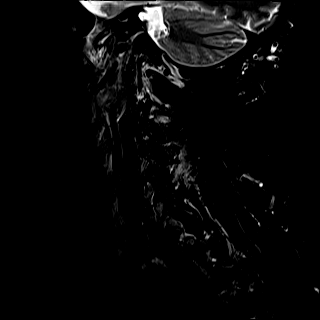
[im 3/15]
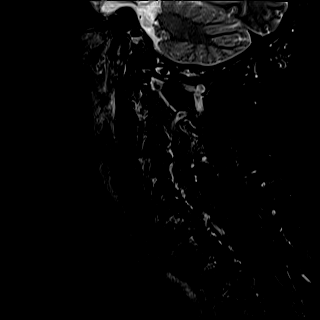
[im 5/15]
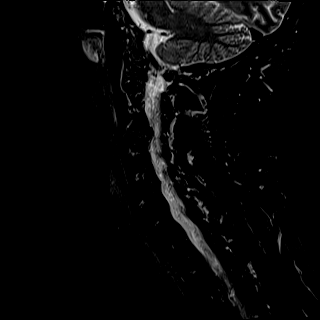
[im 8/15]
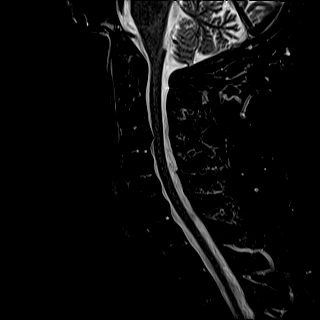
[im 10/15]
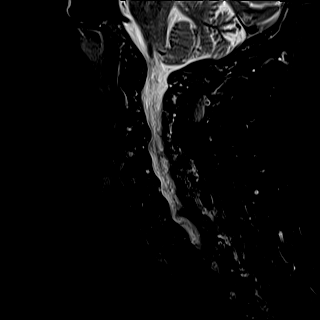
[im 12/15]
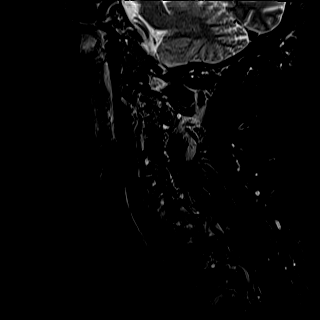
[im 15/15]
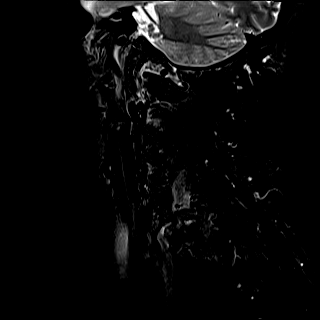

[Series 8: T2 · axial · 3.0mm · 0.70mm/px · z∈[-204,-107]mm · 10 of 29 slices shown (2 of 2)]
[im 1/29]
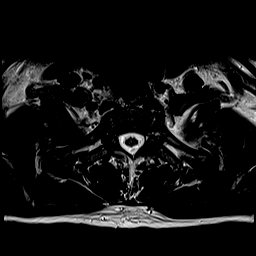
[im 3/29]
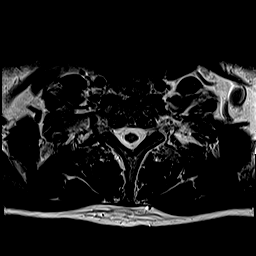
[im 5/29]
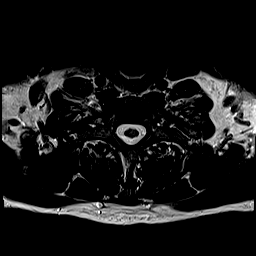
[im 7/29]
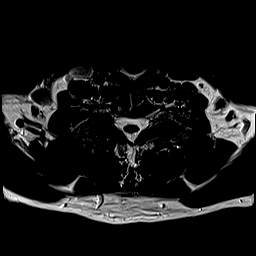
[im 9/29]
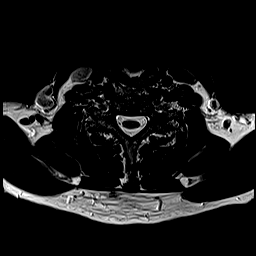
[im 13/29]
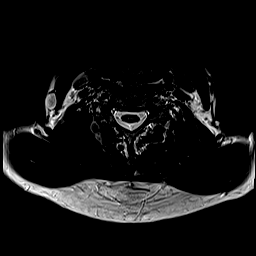
[im 16/29]
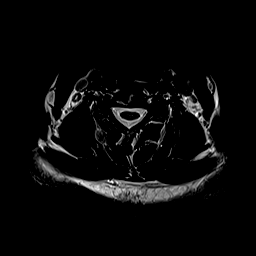
[im 20/29]
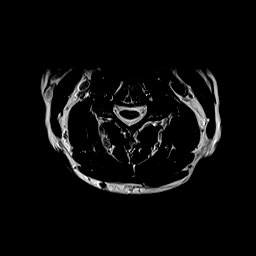
[im 24/29]
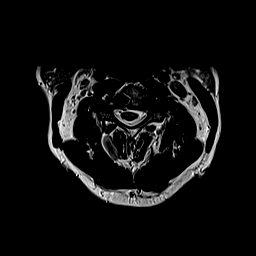
[im 29/29]
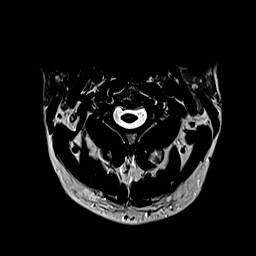

[Series 9: ax mpgr · axial · 3.0mm · 0.35mm/px · z∈[-204,-107]mm · 8 of 29 slices shown]
[im 1/29]
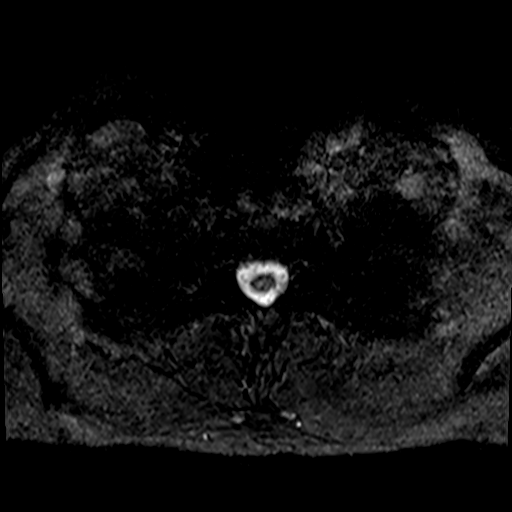
[im 5/29]
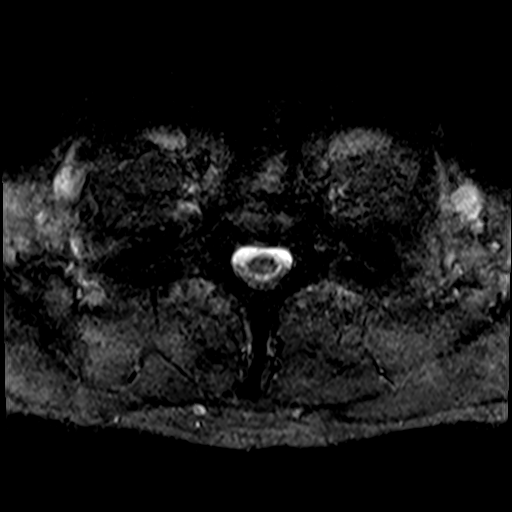
[im 9/29]
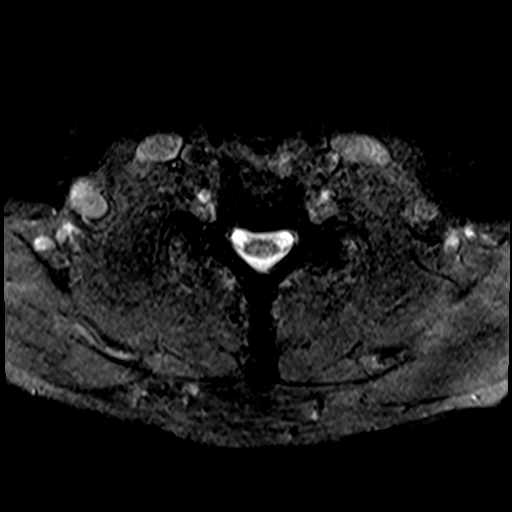
[im 13/29]
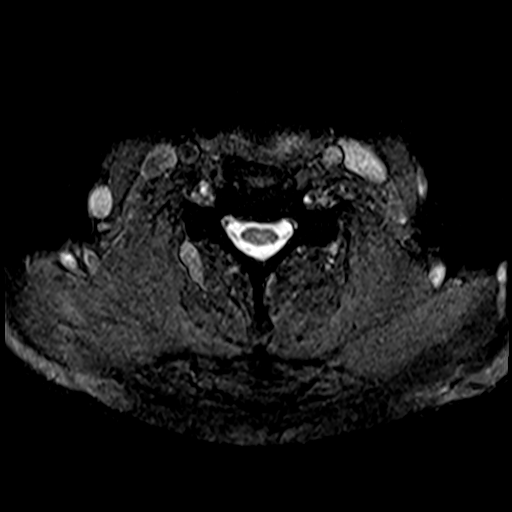
[im 16/29]
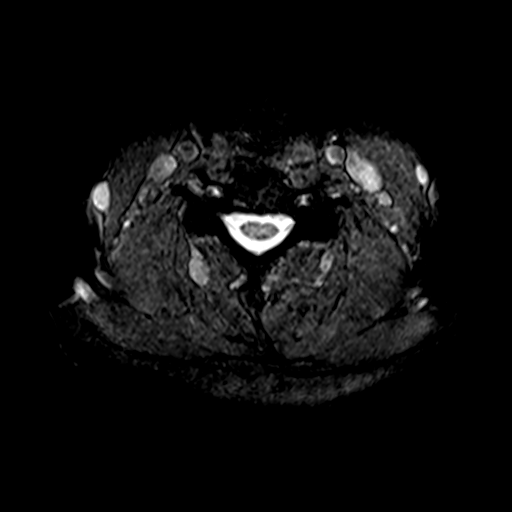
[im 20/29]
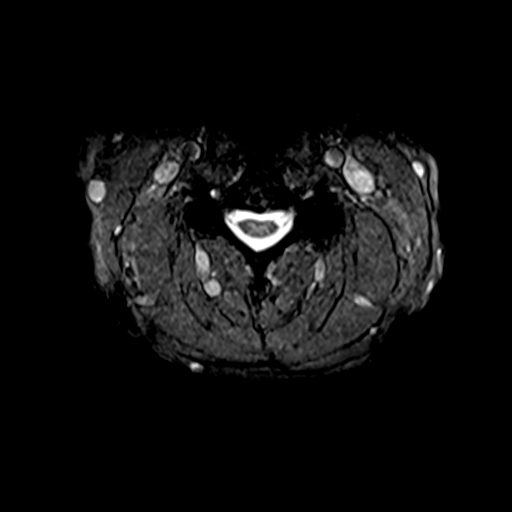
[im 24/29]
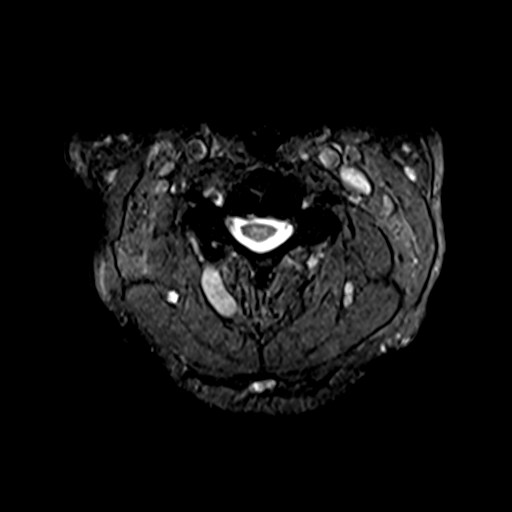
[im 29/29]
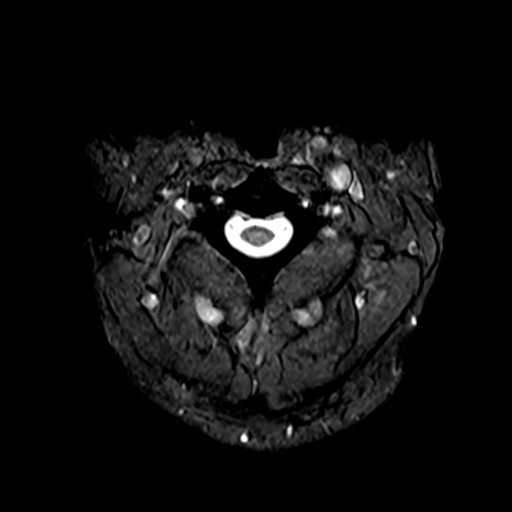

[38 of 48 positions shown; findings below may reference images not displayed]

FINDINGS: Alignment: No significant listhesis.

Vertebrae: No acute fracture or suspicious osseous lesions.

Cord: Normal signal and morphology.

Posterior Fossa, vertebral arteries, paraspinal tissues: Negative.

Disc levels:

C2-C3: No significant disc bulge. Right-greater-than-left facet
arthropathy. No spinal canal stenosis or neuroforaminal narrowing.

C3-C4: Disc osteophyte complex with moderate disc bulge, which
indents the thecal sac and abuts the ventral cord, without spinal
canal stenosis. Uncovertebral and facet arthropathy. Severe left and
moderate right neural foraminal narrowing, which appears similar to
the prior exam.

C4-C5: Mild disc bulge. Left-greater-than-right facet arthropathy.
Moderate to severe left neural foraminal narrowing, which appears
similar to the prior exam.

C5-C6: Disc height loss with mild disc bulge. Facet and
uncovertebral hypertrophy. No spinal canal stenosis. Severe
right-greater-than-left neural foraminal narrowing, which appears
similar to the prior exam.

C6-C7: Disc height loss with mild disc bulge. Facet and
uncovertebral hypertrophy. Moderate bilateral neural foraminal
narrowing, which appears similar to the prior exam.

C7-T1: No significant disc bulge. Facet arthropathy. No spinal canal
stenosis or neuroforaminal narrowing.
IMPRESSION: 1. Multilevel facet and uncovertebral hypertrophy, overall unchanged
from the prior exam, which causes severe neural foraminal narrowing
on the left at C3-C4 and bilaterally at C5-C6. Additional less
significant neural foraminal narrowing, as described above.
2. No spinal canal stenosis.

## 2024-03-12 ENCOUNTER — Encounter: Payer: Self-pay | Admitting: *Deleted
